# Patient Record
Sex: Female | Born: 1975 | Race: Asian | Hispanic: No | Marital: Married | Smoking: Never smoker
Health system: Southern US, Community
[De-identification: ages and names within clinical notes are randomized; demographics above are authoritative.]

## PROBLEM LIST (undated history)

## (undated) HISTORY — PX: NO PAST SURGERIES: SHX2092

---

## 2007-03-01 ENCOUNTER — Inpatient Hospital Stay (HOSPITAL_COMMUNITY): Admission: AD | Admit: 2007-03-01 | Discharge: 2007-03-01 | Payer: Self-pay | Admitting: Obstetrics and Gynecology

## 2007-08-31 ENCOUNTER — Inpatient Hospital Stay (HOSPITAL_COMMUNITY): Admission: AD | Admit: 2007-08-31 | Discharge: 2007-08-31 | Payer: Self-pay | Admitting: Obstetrics and Gynecology

## 2007-10-11 ENCOUNTER — Inpatient Hospital Stay (HOSPITAL_COMMUNITY): Admission: AD | Admit: 2007-10-11 | Discharge: 2007-10-11 | Payer: Self-pay | Admitting: Obstetrics and Gynecology

## 2007-10-12 ENCOUNTER — Inpatient Hospital Stay (HOSPITAL_COMMUNITY): Admission: AD | Admit: 2007-10-12 | Discharge: 2007-10-14 | Payer: Self-pay | Admitting: Obstetrics and Gynecology

## 2010-06-01 ENCOUNTER — Ambulatory Visit: Payer: Self-pay | Admitting: Obstetrics and Gynecology

## 2010-06-01 ENCOUNTER — Inpatient Hospital Stay (HOSPITAL_COMMUNITY): Admission: AD | Admit: 2010-06-01 | Discharge: 2010-06-01 | Payer: Self-pay | Admitting: Obstetrics & Gynecology

## 2010-09-12 ENCOUNTER — Other Ambulatory Visit (HOSPITAL_COMMUNITY): Payer: Self-pay

## 2010-09-12 ENCOUNTER — Inpatient Hospital Stay (HOSPITAL_COMMUNITY)
Admission: AD | Admit: 2010-09-12 | Discharge: 2010-09-12 | Disposition: A | Discharge status: Home / Self Care | Payer: Self-pay | Source: Ambulatory Visit | Attending: Obstetrics & Gynecology | Admitting: Obstetrics & Gynecology

## 2010-09-12 ENCOUNTER — Inpatient Hospital Stay (HOSPITAL_COMMUNITY): Payer: Self-pay

## 2010-09-12 DIAGNOSIS — O99891 Other specified diseases and conditions complicating pregnancy: Secondary | ICD-10-CM | POA: Insufficient documentation

## 2010-09-12 DIAGNOSIS — O0930 Supervision of pregnancy with insufficient antenatal care, unspecified trimester: Secondary | ICD-10-CM | POA: Insufficient documentation

## 2010-09-19 ENCOUNTER — Other Ambulatory Visit: Payer: Self-pay | Admitting: Obstetrics & Gynecology

## 2010-10-04 LAB — URINALYSIS, ROUTINE W REFLEX MICROSCOPIC
Hgb urine dipstick: NEGATIVE
Protein, ur: NEGATIVE mg/dL
Urobilinogen, UA: 1 mg/dL (ref 0.0–1.0)

## 2010-10-04 LAB — POCT PREGNANCY, URINE: Preg Test, Ur: POSITIVE

## 2010-12-06 NOTE — Discharge Summary (Signed)
[name], [name]                 ACCOUNT NO.:  [id number]   MEDICAL RECORD NO.:  [id number]          PATIENT TYPE:  INP   LOCATION:  9112                          FACILITY:  [facility]   PHYSICIAN:  Gerrit Friends. Aldona Bar, M.D.   DATE OF BIRTH:  04/10/1979   DATE OF ADMISSION:  10/12/2007  DATE OF DISCHARGE:  10/14/2007                               DISCHARGE SUMMARY   DISCHARGE DIAGNOSES:  1. Term pregnancy, delivered 6 pound 14 ounce female.  Apgars 9 and 9.  2. Blood type A positive.   PROCEDURE:  1. Vacuum extraction assisted delivery.  2. Fourth-degree episiotomy and repair.   SUMMARY:  This 35 year old Asian female, G 1, P 0, with due date of  October 09, 2007, presented having contractions after a benign pregnancy.  She had a protracted labor curve and pushed over 2-1/2 hours and because  of maternal exhaustion, was ultimately delivered with the assistance of  a vacuum extractor.  There was a fourth-degree midline episiotomy.  The  patient delivered a 6-pound 14-ounce female infant with Apgars of 9 and  9, and her postpartum course was relatively benign.  Discharge  hemoglobin was 10.8 with a white count of 18,400 and a platelet count of  209,000.   She was bottle-feeding and instructed appropriately on the morning of  discharge.  At the time of discharge, she was given a discharge brochure  with all instructions as well as 2 prescriptions - one for Motrin 600 mg  use every 6 hours and the second for Tylox to use 1-2 every 4-6 hours as  needed for severe pain.  She was also told to use a stool softener at  bedtime for the next 2-3 weeks unless she developed very loose stools.    She will return to the office in followup in approximately 4 weeks' time  or as needed.   CONDITION ON DISCHARGE:  Improved.      Gerrit Friends. Aldona Bar, M.D.  Electronically Signed     RMW/MEDQ  D:  10/14/2007  T:  10/15/2007  Job:  [id number]

## 2011-01-10 ENCOUNTER — Inpatient Hospital Stay (HOSPITAL_COMMUNITY)
Admission: EM | Admit: 2011-01-10 | Discharge: 2011-01-13 | DRG: 775 | Disposition: A | Discharge status: Home / Self Care | Payer: Medicaid Other | Source: Ambulatory Visit | Attending: Obstetrics and Gynecology | Admitting: Obstetrics and Gynecology

## 2011-01-11 LAB — CBC
Hemoglobin: 9.6 g/dL — ABNORMAL LOW (ref 12.0–15.0)
MCH: 23.3 pg — ABNORMAL LOW (ref 26.0–34.0)
MCHC: 30.8 g/dL (ref 30.0–36.0)
MCV: 75.7 fL — ABNORMAL LOW (ref 78.0–100.0)
Platelets: 170 10*3/uL (ref 150–400)
RBC: 4.12 MIL/uL (ref 3.87–5.11)

## 2011-01-12 LAB — CBC
Hemoglobin: 7.8 g/dL — ABNORMAL LOW (ref 12.0–15.0)
MCH: 22.8 pg — ABNORMAL LOW (ref 26.0–34.0)
MCHC: 30.2 g/dL (ref 30.0–36.0)
MCV: 75.4 fL — ABNORMAL LOW (ref 78.0–100.0)
RBC: 3.42 MIL/uL — ABNORMAL LOW (ref 3.87–5.11)

## 2011-04-17 LAB — SYPHILIS: RPR W/REFLEX TO RPR TITER AND TREPONEMAL ANTIBODIES, TRADITIONAL SCREENING AND DIAGNOSIS ALGORITHM: RPR Ser Ql: NONREACTIVE

## 2011-04-17 LAB — CBC
HCT: 31 — ABNORMAL LOW
HCT: 38.3
Hemoglobin: 10.8 — ABNORMAL LOW
Hemoglobin: 13.3
MCHC: 34.6
MCHC: 34.8
MCV: 94.6
MCV: 94.9
Platelets: 209
Platelets: 253
RBC: 3.28 — ABNORMAL LOW
RBC: 4.04
RDW: 12.9
RDW: 13.2
WBC: 15.1 — ABNORMAL HIGH
WBC: 18.4 — ABNORMAL HIGH

## 2011-05-08 LAB — POCT PREGNANCY, URINE
Operator id: 273371
Preg Test, Ur: POSITIVE

## 2019-12-09 ENCOUNTER — Other Ambulatory Visit (HOSPITAL_COMMUNITY)
Admission: RE | Admit: 2019-12-09 | Discharge: 2019-12-09 | Disposition: A | Discharge status: Home / Self Care | Payer: 59 | Source: Ambulatory Visit | Attending: Obstetrics & Gynecology | Admitting: Obstetrics & Gynecology

## 2019-12-09 ENCOUNTER — Encounter: Payer: Self-pay | Admitting: Radiology

## 2019-12-09 ENCOUNTER — Encounter: Payer: Self-pay | Admitting: Obstetrics & Gynecology

## 2019-12-09 ENCOUNTER — Other Ambulatory Visit: Payer: Self-pay

## 2019-12-09 ENCOUNTER — Ambulatory Visit (INDEPENDENT_AMBULATORY_CARE_PROVIDER_SITE_OTHER): Payer: 59 | Admitting: Obstetrics & Gynecology

## 2019-12-09 VITALS — BP 120/78 | HR 78 | Ht 59.0 in | Wt 115.0 lb

## 2019-12-09 DIAGNOSIS — Z975 Presence of (intrauterine) contraceptive device: Secondary | ICD-10-CM | POA: Diagnosis not present

## 2019-12-09 DIAGNOSIS — Z809 Family history of malignant neoplasm, unspecified: Secondary | ICD-10-CM

## 2019-12-09 DIAGNOSIS — Z01419 Encounter for gynecological examination (general) (routine) without abnormal findings: Secondary | ICD-10-CM | POA: Insufficient documentation

## 2019-12-09 DIAGNOSIS — Z1231 Encounter for screening mammogram for malignant neoplasm of breast: Secondary | ICD-10-CM

## 2019-12-09 NOTE — Progress Notes (Signed)
GYNECOLOGY ANNUAL PREVENTATIVE CARE ENCOUNTER NOTE  History:     Olivia [name] is a 44 y.o. [id number] female here for a routine annual gynecologic exam and to establish care.  Had been seen by [facility] for several years, wanted to come here due to proximity to home.  Here with her husband who is doing interpreting for her, declined [place] interpreter services (refusal form signed an scanned into chart).  Current complaints: none.  Had Nexplanon placed last year around 03/2019, no complaints.  Wants to talk about possible genetic testing given FH of breast and uterine/cervical cancer (see FH below).   Denies abnormal vaginal bleeding, discharge, pelvic pain, problems with intercourse or other gynecologic concerns.    Gynecologic History Patient's last menstrual period was 12/02/2019 (approximate). Contraception: Nexplanon Last Pap: 03/2019. Results were: normal with negative HPV Never had a mammogram.  Obstetric History OB History  Gravida Para Term Preterm AB Living  3 3 3     3   SAB TAB Ectopic Multiple Live Births          3    # Outcome Date GA Lbr Len/2nd Weight Sex Delivery Anes PTL Lv  3 Term 2012     Vag-Spont     2 Term 2005     Vag-Spont     1 Term 2002     Vag-Spont      History reviewed. No pertinent past medical history.  History reviewed. No pertinent surgical history.  Current Outpatient Medications on File Prior to Visit  Medication Sig Dispense Refill  . Cetirizine HCl 10 MG CAPS Take 10 mg by mouth daily.     No current facility-administered medications on file prior to visit.   No Known Allergies  Social History:  reports that she has never smoked. She has never used smokeless tobacco. She reports previous alcohol use. She reports previous drug use.  Family History  Problem Relation Age of Onset  . Cervical cancer Mother 27  . Breast cancer Maternal Aunt 32  . Cervical cancer Maternal Aunt 45   The following portions of the patient's  history were reviewed and updated as appropriate: allergies, current medications, past family history, past medical history, past social history, past surgical history and problem list.  Review of Systems Pertinent items noted in HPI and remainder of comprehensive ROS otherwise negative.  Physical Exam:  BP 120/78   Pulse 78   Ht 4\' 11"  (1.499 m)   Wt 115 lb (52.2 kg)   LMP 12/02/2019 (Approximate)   BMI 23.23 kg/m  CONSTITUTIONAL: Well-developed, well-nourished female in no acute distress.  HENT:  Normocephalic, atraumatic, External right and left ear normal. Oropharynx is clear and moist EYES: Conjunctivae and EOM are normal. Pupils are equal, round, and reactive to light. No scleral icterus.  NECK: Normal range of motion, supple, no masses.  Normal thyroid.  SKIN: Skin is warm and dry. No rash noted. Not diaphoretic. No erythema. No pallor. MUSCULOSKELETAL: Normal range of motion. No tenderness.  No cyanosis, clubbing, or edema.  2+ distal pulses. NEUROLOGIC: Alert and oriented to person, place, and time. Normal reflexes, muscle tone coordination.  PSYCHIATRIC: Normal mood and affect. Normal behavior. Normal judgment and thought content. CARDIOVASCULAR: Normal heart rate noted, regular rhythm RESPIRATORY: Clear to auscultation bilaterally. Effort and breath sounds normal, no problems with respiration noted. BREASTS: Symmetric in size. No masses, tenderness, skin changes, nipple drainage, or lymphadenopathy bilaterally. Performed in the presence of a chaperone. ABDOMEN: Soft, no  distention noted.  No tenderness, rebound or guarding.  PELVIC: Normal appearing external genitalia and urethral meatus; normal appearing vaginal mucosa and cervix.  No abnormal discharge noted.  Pap smear obtained.  Normal uterine size, no other palpable masses, no uterine or adnexal tenderness.  Performed in the presence of a chaperone.   Assessment and Plan:      1. Nexplanon in place No issues. Due for  removal/replacement in 03/2022.   2. Breast cancer screening by mammogram Mammogram scheduled - MM 3D SCREEN BREAST BILATERAL; Future  3. FH: cancer Referred to Genetics for further discussion. Did emphasize that if the uterine cancers were cervical, that was HPV related and not necessarily genetic.  - Ambulatory referral to Genetics  4. Well woman exam with routine gynecological exam - Cytology - PAP Will follow up results of pap smear and manage accordingly. Routine preventative health maintenance measures emphasized. Please refer to After Visit Summary for other counseling recommendations.      Jaynie Collins, MD, FACOG Obstetrician & Gynecologist, [facility] for [employer], [facility]

## 2019-12-09 NOTE — Patient Instructions (Addendum)
C?m n qu v? v  ng k MyChart. Vui lng lm theo h?ng d?n d?i y ? truy c?p m?t cch an ton vo h? s y t? tr?c tuy?n c?a qu v?. MyChart cho php qu v?  g?i tin nh?n t?i bc s?, xem k?t qu? xt nghi?m c?a mnh, qu?n l cc bu?i h?n khm  v hn th? n?a.   Lm Cch No ? Ti C Th? ng K? 1. Trong trnh duy?t Internet c?a qu v?, truy c?p [name] [email or web address]. 2. Nh?p vo lin k?t ng K Ngay (Sign Up Now) trong khung ng Nh?p. Qu v? s? th?y [name] ng K Thnh Vin M?i. 3. Nh?p chnh xc M Truy C?p MyChart c?a qu v? nh xu?t hi?n ? bn d?i. Qu v? s? khng c?n ph?i dng t?i m ny n?a sau khi qu v?  hon thnh qu trnh ng k. N?u qu v? khng ng k tr?c ngy h?t h?n, qu v? ph?i yu c?u m?t m m?i.  M Truy C?p MyChart: [id number] Expires: 01/23/2020 10:18 AM  4. Nh?p S? An Sinh X H?i ([id number]) v Ngy Sinh (mm/dd/yyyy) c?a qu  v? nh ?c ch? ?nh v nh?p vo G?i (Submit). Qu v? s? ?c a t?i [name] ng k ti?p theo. 5. T?o ID MyChart. y s? l ID ng nh?p MyChart c?a qu v? v khng th?  thay ?i, v v?y hy ngh? t?i ID ? b?o m?t v d? nh?. 6. T?o m?t kh?u MyChart. Qu v? c th? thay ?i m?t kh?u c?a mnh vo b?t k?  lc no. 7. Nh?p Cu H?i v Cu Tr? L?i ?t L?i M?t Kh?u c?a qu v?. Qu v? c th? s? d?ng cu h?i v cu tr? l?i ny khi qu v? qun m?t m?t kh?u c?a mnh.  8. Nh?p ?a ch? e-mail c?a qu v?. Qu v? s? nh?n ?c thng bo e-mail khi c  s?n thng tin m?i trong MyChart. 9. Nh?p vo ng K (Sign Up). Gi? th qu v? c th? xem h? s y t? c?a mnh.   [name] nh? r?ng, MyChart [facility] ?c s? d?ng cho cc nhu c?u kh?n c?p. ?i v?i cc tr?ng h?p c?p c?u y t?, xin quay s? 911.    Preventive Care 43-43 Years Old, Female Preventive care refers to visits with your health care provider and lifestyle choices that can promote health and wellness. This includes:  A yearly physical exam.  This may also be called an annual well check.  Regular dental visits and eye exams.  Immunizations.  Screening for certain conditions.  Healthy lifestyle choices, such as eating a healthy diet, getting regular exercise, not using drugs or products that contain nicotine and tobacco, and limiting alcohol use. What can I expect for my preventive care visit? Physical exam Your health care provider will check your:  Height and weight. This may be used to calculate body mass index (BMI), which tells if you are at a healthy weight.  Heart rate and blood pressure.  Skin for abnormal spots. Counseling Your health care provider may ask you questions about your:  Alcohol, tobacco, and drug use.  Emotional well-being.  Home and relationship well-being.  Sexual activity.  Eating habits.  Work and work Statistician.  Method of birth control.  Menstrual cycle.  Pregnancy history. What immunizations do I need?  Influenza (flu) vaccine  This is recommended every year. Tetanus, diphtheria, and pertussis (Tdap) vaccine  You may need a  Td booster every 10 years. Varicella (chickenpox) vaccine  You may need this if you have not been vaccinated. Zoster (shingles) vaccine  You may need this after age 61. Measles, mumps, and rubella (MMR) vaccine  You may need at least one dose of MMR if you were born in 1957 or later. You may also need a second dose. Pneumococcal conjugate (PCV13) vaccine  You may need this if you have certain conditions and were not previously vaccinated. Pneumococcal polysaccharide (PPSV23) vaccine  You may need one or two doses if you smoke cigarettes or if you have certain conditions. Meningococcal conjugate (MenACWY) vaccine  You may need this if you have certain conditions. Hepatitis A vaccine  You may need this if you have certain conditions or if you travel or work in places where you may be exposed to hepatitis A. Hepatitis B vaccine  You may  need this if you have certain conditions or if you travel or work in places where you may be exposed to hepatitis B. Haemophilus influenzae type b (Hib) vaccine  You may need this if you have certain conditions. Human papillomavirus (HPV) vaccine  If recommended by your health care provider, you may need three doses over 6 months. You may receive vaccines as individual doses or as more than one vaccine together in one shot (combination vaccines). Talk with your health care provider about the risks and benefits of combination vaccines. What tests do I need? Blood tests  Lipid and cholesterol levels. These may be checked every 5 years, or more frequently if you are over 2 years old.  Hepatitis C test.  Hepatitis B test. Screening  Lung cancer screening. You may have this screening every year starting at age 35 if you have a 30-pack-year history of smoking and currently smoke or have quit within the past 15 years.  Colorectal cancer screening. All adults should have this screening starting at age 25 and continuing until age 35. Your health care provider may recommend screening at age 30 if you are at increased risk. You will have tests every 1-10 years, depending on your results and the type of screening test.  Diabetes screening. This is done by checking your blood sugar (glucose) after you have not eaten for a while (fasting). You may have this done every 1-3 years.  Mammogram. This may be done every 1-2 years. Talk with your health care provider about when you should start having regular mammograms. This may depend on whether you have a family history of breast cancer.  BRCA-related cancer screening. This may be done if you have a family history of breast, ovarian, tubal, or peritoneal cancers.  Pelvic exam and Pap test. This may be done every 3 years starting at age 64. Starting at age 4, this may be done every 5 years if you have a Pap test in combination with an HPV test. Other  tests  Sexually transmitted disease (STD) testing.  Bone density scan. This is done to screen for osteoporosis. You may have this scan if you are at high risk for osteoporosis. Follow these instructions at home: Eating and drinking  Eat a diet that includes fresh fruits and vegetables, whole grains, lean protein, and low-fat dairy.  Take vitamin and mineral supplements as recommended by your health care provider.  Do not drink alcohol if: ? Your health care provider tells you not to drink. ? You are pregnant, may be pregnant, or are planning to become pregnant.  If you drink alcohol: ?  Limit how much you have to 0-1 drink a day. ? Be aware of how much alcohol is in your drink. In the [place] one drink equals one 12 oz bottle of beer (355 mL), one 5 oz glass of wine (148 mL), or one 1 oz glass of hard liquor (44 mL). Lifestyle  Take daily care of your teeth and gums.  Stay active. Exercise for at least 30 minutes on 5 or more days each week.  Do not use any products that contain nicotine or tobacco, such as cigarettes, e-cigarettes, and chewing tobacco. If you need help quitting, ask your health care provider.  If you are sexually active, practice safe sex. Use a condom or other form of birth control (contraception) in order to prevent pregnancy and STIs (sexually transmitted infections).  If told by your health care provider, take low-dose aspirin daily starting at age 72. What's next?  Visit your health care provider once a year for a well check visit.  Ask your health care provider how often you should have your eyes and teeth checked.  Stay up to date on all vaccines. This information is not intended to replace advice given to you by your health care provider. Make sure you discuss any questions you have with your health care provider. Document Revised: 03/21/2018 Document Reviewed: 03/21/2018 Elsevier Patient Education  2020 [employer].

## 2019-12-10 LAB — CYTOLOGY - PAP
Comment: NEGATIVE
Diagnosis: NEGATIVE
High risk HPV: NEGATIVE

## 2019-12-26 ENCOUNTER — Ambulatory Visit: Payer: 59

## 2020-02-05 ENCOUNTER — Other Ambulatory Visit: Payer: Self-pay

## 2020-02-05 ENCOUNTER — Ambulatory Visit
Admission: RE | Admit: 2020-02-05 | Discharge: 2020-02-05 | Disposition: A | Discharge status: Home / Self Care | Payer: 59 | Source: Ambulatory Visit | Attending: Obstetrics & Gynecology | Admitting: Obstetrics & Gynecology

## 2020-02-05 DIAGNOSIS — Z1231 Encounter for screening mammogram for malignant neoplasm of breast: Secondary | ICD-10-CM

## 2020-02-10 ENCOUNTER — Other Ambulatory Visit: Payer: Self-pay | Admitting: Obstetrics & Gynecology

## 2020-02-10 DIAGNOSIS — R928 Other abnormal and inconclusive findings on diagnostic imaging of breast: Secondary | ICD-10-CM

## 2020-02-20 ENCOUNTER — Other Ambulatory Visit: Payer: Self-pay

## 2020-02-20 ENCOUNTER — Ambulatory Visit
Admission: RE | Admit: 2020-02-20 | Discharge: 2020-02-20 | Disposition: A | Discharge status: Home / Self Care | Payer: 59 | Source: Ambulatory Visit | Attending: Obstetrics & Gynecology | Admitting: Obstetrics & Gynecology

## 2020-02-20 ENCOUNTER — Ambulatory Visit: Payer: 59

## 2020-02-20 DIAGNOSIS — R928 Other abnormal and inconclusive findings on diagnostic imaging of breast: Secondary | ICD-10-CM

## 2020-08-02 ENCOUNTER — Ambulatory Visit: Payer: 59 | Admitting: Family Medicine

## 2020-08-02 ENCOUNTER — Other Ambulatory Visit: Payer: Self-pay

## 2020-08-02 ENCOUNTER — Encounter: Payer: Self-pay | Admitting: Family Medicine

## 2020-08-02 VITALS — BP 120/78 | HR 76 | Temp 97.8°F | Ht 58.1 in | Wt 110.5 lb

## 2020-08-02 DIAGNOSIS — J302 Other seasonal allergic rhinitis: Secondary | ICD-10-CM | POA: Insufficient documentation

## 2020-08-02 DIAGNOSIS — Z23 Encounter for immunization: Secondary | ICD-10-CM | POA: Diagnosis not present

## 2020-08-02 DIAGNOSIS — Z1159 Encounter for screening for other viral diseases: Secondary | ICD-10-CM

## 2020-08-02 DIAGNOSIS — Z114 Encounter for screening for human immunodeficiency virus [HIV]: Secondary | ICD-10-CM | POA: Diagnosis not present

## 2020-08-02 DIAGNOSIS — R5383 Other fatigue: Secondary | ICD-10-CM

## 2020-08-02 DIAGNOSIS — Z13220 Encounter for screening for lipoid disorders: Secondary | ICD-10-CM

## 2020-08-02 DIAGNOSIS — R400 Somnolence: Secondary | ICD-10-CM | POA: Diagnosis not present

## 2020-08-02 HISTORY — DX: Other fatigue: R53.83

## 2020-08-02 HISTORY — DX: Other seasonal allergic rhinitis: J30.2

## 2020-08-02 LAB — CBC
HCT: 36.9 % (ref 36.0–46.0)
Hemoglobin: 12 g/dL (ref 12.0–15.0)
MCHC: 32.5 g/dL (ref 30.0–36.0)
MCV: 85.8 fl (ref 78.0–100.0)
Platelets: 278 10*3/uL (ref 150.0–400.0)
RBC: 4.3 Mil/uL (ref 3.87–5.11)
RDW: 12.3 % (ref 11.5–15.5)
WBC: 6.4 10*3/uL (ref 4.0–10.5)

## 2020-08-02 LAB — COMPREHENSIVE METABOLIC PANEL
ALT: 11 U/L (ref 0–35)
AST: 14 U/L (ref 0–37)
Albumin: 4.7 g/dL (ref 3.5–5.2)
Alkaline Phosphatase: 36 U/L — ABNORMAL LOW (ref 39–117)
BUN: 13 mg/dL (ref 6–23)
CO2: 32 mEq/L (ref 19–32)
Calcium: 9.7 mg/dL (ref 8.4–10.5)
Chloride: 104 mEq/L (ref 96–112)
Creatinine, Ser: 0.7 mg/dL (ref 0.40–1.20)
GFR: 105.48 mL/min (ref >60.00)
Glucose, Bld: 96 mg/dL (ref 70–99)
Potassium: 3.9 mEq/L (ref 3.5–5.1)
Sodium: 139 mEq/L (ref 135–145)
Total Bilirubin: 0.5 mg/dL (ref 0.2–1.2)
Total Protein: 7.6 g/dL (ref 6.0–8.3)

## 2020-08-02 LAB — LDL CHOLESTEROL, DIRECT: Direct LDL: 125 mg/dL

## 2020-08-02 LAB — LIPID PANEL
Cholesterol: 216 mg/dL — ABNORMAL HIGH (ref 0–200)
HDL: 54.6 mg/dL (ref >39.00)
NonHDL: 161.5
Total CHOL/HDL Ratio: 4
Triglycerides: 249 mg/dL — ABNORMAL HIGH (ref 0.0–149.0)
VLDL: 49.8 mg/dL — ABNORMAL HIGH (ref 0.0–40.0)

## 2020-08-02 LAB — TSH: TSH: 0.85 u[IU]/mL (ref 0.35–4.50)

## 2020-08-02 LAB — FERRITIN: Ferritin: 50.9 ng/mL (ref 10.0–291.0)

## 2020-08-02 LAB — VITAMIN D 25 HYDROXY (VIT D DEFICIENCY, FRACTURES): VITD: 30.2 ng/mL (ref 30.00–100.00)

## 2020-08-02 NOTE — Assessment & Plan Note (Signed)
Will get labs. Epworth 5, so consider sleep apnea though normal weight but does snore.

## 2020-08-02 NOTE — Progress Notes (Signed)
Subjective:     Olivia [name] is a 45 y.o. female presenting for Establish Care (Wants blood work)   Here with husband Declined interpretor   HPI   #Fatigue - notes that she feels sleepy during the day - abnormal bleeding - 10 days of bleeding in the last month, very light flow with nexplanon inplace - husband says she snores, does not stop breathing - sleeps about 7 hours at night - normal amount, no insomnia concerns - occasionally waking up tired  Review of Systems  Constitutional: Negative for chills, fever and unexpected weight change.  Eyes:       Blurry vision  Endocrine: Negative for cold intolerance and heat intolerance.  Skin: Negative for color change and rash.  Neurological: Positive for headaches. Negative for dizziness and light-headedness.     Social History   Tobacco Use  Smoking Status Never Smoker  Smokeless Tobacco Never Used        Objective:    BP Readings from Last 3 Encounters:  08/02/20 120/78  12/09/19 120/78   Wt Readings from Last 3 Encounters:  08/02/20 110 lb 8 oz (50.1 kg)  12/09/19 115 lb (52.2 kg)    BP 120/78   Pulse 76   Temp 97.8 F (36.6 C) (Temporal)   Ht 4' 10.1" (1.476 m)   Wt 110 lb 8 oz (50.1 kg)   SpO2 100%   BMI 23.02 kg/m    Physical Exam Constitutional:      General: She is not in acute distress.    Appearance: She is well-developed. She is not diaphoretic.  HENT:     Right Ear: External ear normal.     Left Ear: External ear normal.     Nose: Nose normal.     Mouth/Throat:     Mouth: Mucous membranes are moist.     Tonsils: 0 on the right. 0 on the left.  Eyes:     Conjunctiva/sclera: Conjunctivae normal.  Neck:     Comments: Normal thyroid Cardiovascular:     Rate and Rhythm: Normal rate and regular rhythm.     Heart sounds: No murmur heard.   Pulmonary:     Effort: Pulmonary effort is normal. No respiratory distress.     Breath sounds: Normal breath sounds. No wheezing.   Musculoskeletal:     Cervical back: Normal range of motion and neck supple. No tenderness.  Skin:    General: Skin is warm and dry.     Capillary Refill: Capillary refill takes less than 2 seconds.  Neurological:     Mental Status: She is alert. Mental status is at baseline.  Psychiatric:        Mood and Affect: Mood normal.        Behavior: Behavior normal.      Results of the Epworth flowsheet 08/02/2020  Sitting and reading 1  Watching TV 0  Sitting, inactive in a public place (e.g. a theatre or a meeting) 0  As a passenger in a car for an hour without a break 0  Lying down to rest in the afternoon when circumstances permit 3  Sitting and talking to someone 0  Sitting quietly after a lunch without alcohol 1  In a car, while stopped for a few minutes in traffic 0  Total score 5        Assessment & Plan:   Problem List Items Addressed This Visit      Other   Seasonal allergies  Other fatigue - Primary    Will get labs. Epworth 5, so consider sleep apnea though normal weight but does snore.       Relevant Orders   Comprehensive metabolic panel   CBC   Ferritin   Vitamin D, 25-hydroxy   TSH    Other Visit Diagnoses    Need for hepatitis C screening test       Relevant Orders   Hepatitis C antibody   Encounter for screening for HIV       Relevant Orders   HIV Antibody (routine testing w rflx)   Daytime sleepiness       Relevant Orders   Comprehensive metabolic panel   CBC   Ferritin   Vitamin D, 25-hydroxy   TSH   Screening for hyperlipidemia       Relevant Orders   Lipid panel   Need for Tdap vaccination       Relevant Orders   Tdap vaccine greater than or equal to 7yo IM (Completed)       Return if symptoms worsen or fail to improve.  Lynnda Child, MD  This visit occurred during the SARS-CoV-2 public health emergency.  Safety protocols were in place, including screening questions prior to the visit, additional usage of staff PPE, and  extensive cleaning of exam room while observing appropriate contact time as indicated for disinfecting solutions.

## 2020-08-02 NOTE — Patient Instructions (Signed)
Daytime sleepiness - will start with blood work - if everything is normal, will likely recommend sleep apnea referral  Hand lesion - this may be a cyst - it should get better on its own - if worsening or not improving - return to see Dr. Patsy Lager

## 2020-08-03 LAB — HEPATITIS C ANTIBODY
Hepatitis C Ab: NONREACTIVE
SIGNAL TO CUT-OFF: 0.01 (ref <1.00)

## 2020-08-03 LAB — HIV ANTIBODY (ROUTINE TESTING W REFLEX): HIV 1&2 Ab, 4th Generation: NONREACTIVE

## 2020-10-18 ENCOUNTER — Encounter: Payer: Self-pay | Admitting: Radiology

## 2020-12-14 ENCOUNTER — Encounter: Payer: Self-pay | Admitting: Obstetrics & Gynecology

## 2020-12-14 ENCOUNTER — Other Ambulatory Visit: Payer: Self-pay

## 2020-12-14 ENCOUNTER — Ambulatory Visit (INDEPENDENT_AMBULATORY_CARE_PROVIDER_SITE_OTHER): Payer: 59 | Admitting: Obstetrics & Gynecology

## 2020-12-14 VITALS — BP 108/71 | HR 81 | Ht 59.0 in | Wt 112.8 lb

## 2020-12-14 DIAGNOSIS — Z1231 Encounter for screening mammogram for malignant neoplasm of breast: Secondary | ICD-10-CM

## 2020-12-14 DIAGNOSIS — Z975 Presence of (intrauterine) contraceptive device: Secondary | ICD-10-CM | POA: Diagnosis not present

## 2020-12-14 DIAGNOSIS — Z01419 Encounter for gynecological examination (general) (routine) without abnormal findings: Secondary | ICD-10-CM | POA: Diagnosis not present

## 2020-12-14 NOTE — Progress Notes (Signed)
GYNECOLOGY ANNUAL PREVENTATIVE CARE ENCOUNTER NOTE  History:     Aleasha Fregeau Rosangelica Pevehouse is a 45 y.o. [phone] female here for a routine annual exam. Here with her husband who is doing interpreting for her, declined [place] medical interpreter services (refusal form signed an scanned into chart). Current complaints: none.   Denies abnormal vaginal bleeding, discharge, pelvic pain, problems with intercourse or other gynecologic concerns.    Gynecologic History No LMP recorded. Patient has had an implant. Contraception: Nexplanon placed 03/2019, no issues. Last Pap: 12/09/2019. Results were: normal with negative HPV Last mammogram: 730/2021. Results were: normal  Obstetric History OB History  Gravida Para Term Preterm AB Living  3 3 3     3   SAB IAB Ectopic Multiple Live Births          3    # Outcome Date GA Lbr Len/2nd Weight Sex Delivery Anes PTL Lv  3 Term 2012     Vag-Spont     2 Term 2005     Vag-Spont     1 Term 2002     Vag-Spont       Past Medical History:  Diagnosis Date  . Other fatigue 08/02/2020  . Seasonal allergies 08/02/2020    History reviewed. No pertinent surgical history.  Current Outpatient Medications on File Prior to Visit  Medication Sig Dispense Refill  . etonogestrel (NEXPLANON) 68 MG IMPL implant Nexplanon 68 mg subdermal implant  Inject by subcutaneous route. Due for removal in 3 years    . Cetirizine HCl 10 MG CAPS Take 10 mg by mouth daily. (Patient not taking: Reported on 08/02/2020)     No current facility-administered medications on file prior to visit.    No Known Allergies  Social History:  reports that she has never smoked. She has never used smokeless tobacco. She reports previous alcohol use. She reports previous drug use.  Family History  Problem Relation Age of Onset  . Cervical cancer Mother 62  . Breast cancer Maternal Aunt 49  . Cervical cancer Maternal Aunt 45  . Heart attack Maternal Grandfather     The following  portions of the patient's history were reviewed and updated as appropriate: allergies, current medications, past family history, past medical history, past social history, past surgical history and problem list.  Review of Systems Pertinent items noted in HPI and remainder of comprehensive ROS otherwise negative.  Physical Exam:  BP 108/71   Pulse 81   Ht 4\' 11"  (1.499 m)   Wt 112 lb 12.8 oz (51.2 kg)   BMI 22.78 kg/m  CONSTITUTIONAL: Well-developed, well-nourished female in no acute distress.  HENT:  Normocephalic, atraumatic, External right and left ear normal.  EYES: Conjunctivae and EOM are normal. Pupils are equal, round, and reactive to light. No scleral icterus.  NECK: Normal range of motion, supple, no masses.  Normal thyroid.  SKIN: Skin is warm and dry. No rash noted. Not diaphoretic. No erythema. No pallor. MUSCULOSKELETAL: Normal range of motion. No tenderness.  No cyanosis, clubbing, or edema. NEUROLOGIC: Alert and oriented to person, place, and time. Normal reflexes, muscle tone coordination.  PSYCHIATRIC: Normal mood and affect. Normal behavior. Normal judgment and thought content. CARDIOVASCULAR: Normal heart rate noted, regular rhythm RESPIRATORY: Clear to auscultation bilaterally. Effort and breath sounds normal, no problems with respiration noted. BREASTS: Symmetric in size. No masses, tenderness, skin changes, nipple drainage, or lymphadenopathy bilaterally. Performed in the presence of a chaperone. ABDOMEN: Soft, no distention noted.  No  tenderness, rebound or guarding.  PELVIC: Deferred   Assessment and Plan:      1. Nexplanon in place No issues. Due for removal/replacement in 03/2022.  2. Breast cancer screening by mammogram Mammogram scheduled - MM 3D SCREEN BREAST BILATERAL; Future  3. Well woman exam Up to date with pap smear screening, due for next one in 11/2022 Routine preventative health maintenance measures emphasized. Please refer to After Visit  Summary for other counseling recommendations.      Jaynie Collins, MD, FACOG Obstetrician & Gynecologist, [facility] for [employer], [facility]

## 2020-12-14 NOTE — Patient Instructions (Signed)
Preventive Care 84-44 Years Old, Female Preventive care refers to lifestyle choices and visits with your health care provider that can promote health and wellness. This includes:  A yearly physical exam. This is also called an annual wellness visit.  Regular dental and eye exams.  Immunizations.  Screening for certain conditions.  Healthy lifestyle choices, such as: ? Eating a healthy diet. ? Getting regular exercise. ? Not using drugs or products that contain nicotine and tobacco. ? Limiting alcohol use. What can I expect for my preventive care visit? Physical exam Your health care provider will check your:  Height and weight. These may be used to calculate your BMI (body mass index). BMI is a measurement that tells if you are at a healthy weight.  Heart rate and blood pressure.  Body temperature.  Skin for abnormal spots. Counseling Your health care provider may ask you questions about your:  Past medical problems.  Family's medical history.  Alcohol, tobacco, and drug use.  Emotional well-being.  Home life and relationship well-being.  Sexual activity.  Diet, exercise, and sleep habits.  Work and work Statistician.  Access to firearms.  Method of birth control.  Menstrual cycle.  Pregnancy history. What immunizations do I need? Vaccines are usually given at various ages, according to a schedule. Your health care provider will recommend vaccines for you based on your age, medical history, and lifestyle or other factors, such as travel or where you work.   What tests do I need? Blood tests  Lipid and cholesterol levels. These may be checked every 5 years, or more often if you are over 3 years old.  Hepatitis C test.  Hepatitis B test. Screening  Lung cancer screening. You may have this screening every year starting at age 73 if you have a 30-pack-year history of smoking and currently smoke or have quit within the past 15 years.  Colorectal cancer  screening. ? All adults should have this screening starting at age 52 and continuing until age 17. ? Your health care provider may recommend screening at age 49 if you are at increased risk. ? You will have tests every 1-10 years, depending on your results and the type of screening test.  Diabetes screening. ? This is done by checking your blood sugar (glucose) after you have not eaten for a while (fasting). ? You may have this done every 1-3 years.  Mammogram. ? This may be done every 1-2 years. ? Talk with your health care provider about when you should start having regular mammograms. This may depend on whether you have a family history of breast cancer.  BRCA-related cancer screening. This may be done if you have a family history of breast, ovarian, tubal, or peritoneal cancers.  Pelvic exam and Pap test. ? This may be done every 3 years starting at age 10. ? Starting at age 11, this may be done every 5 years if you have a Pap test in combination with an HPV test. Other tests  STD (sexually transmitted disease) testing, if you are at risk.  Bone density scan. This is done to screen for osteoporosis. You may have this scan if you are at high risk for osteoporosis. Talk with your health care provider about your test results, treatment options, and if necessary, the need for more tests. Follow these instructions at home: Eating and drinking  Eat a diet that includes fresh fruits and vegetables, whole grains, lean protein, and low-fat dairy products.  Take vitamin and mineral supplements  as recommended by your health care provider.  Do not drink alcohol if: ? Your health care provider tells you not to drink. ? You are pregnant, may be pregnant, or are planning to become pregnant.  If you drink alcohol: ? Limit how much you have to 0-1 drink a day. ? Be aware of how much alcohol is in your drink. In the [place] one drink equals one 12 oz bottle of beer (355 mL), one 5 oz glass of  wine (148 mL), or one 1 oz glass of hard liquor (44 mL).   Lifestyle  Take daily care of your teeth and gums. Brush your teeth every morning and night with fluoride toothpaste. Floss one time each day.  Stay active. Exercise for at least 30 minutes 5 or more days each week.  Do not use any products that contain nicotine or tobacco, such as cigarettes, e-cigarettes, and chewing tobacco. If you need help quitting, ask your health care provider.  Do not use drugs.  If you are sexually active, practice safe sex. Use a condom or other form of protection to prevent STIs (sexually transmitted infections).  If you do not wish to become pregnant, use a form of birth control. If you plan to become pregnant, see your health care provider for a prepregnancy visit.  If told by your health care provider, take low-dose aspirin daily starting at age 50.  Find healthy ways to cope with stress, such as: ? Meditation, yoga, or listening to music. ? Journaling. ? Talking to a trusted person. ? Spending time with friends and family. Safety  Always wear your seat belt while driving or riding in a vehicle.  Do not drive: ? If you have been drinking alcohol. Do not ride with someone who has been drinking. ? When you are tired or distracted. ? While texting.  Wear a helmet and other protective equipment during sports activities.  If you have firearms in your house, make sure you follow all gun safety procedures. What's next?  Visit your health care provider once a year for an annual wellness visit.  Ask your health care provider how often you should have your eyes and teeth checked.  Stay up to date on all vaccines. This information is not intended to replace advice given to you by your health care provider. Make sure you discuss any questions you have with your health care provider. Document Revised: 04/13/2020 Document Reviewed: 03/21/2018 Elsevier Patient Education  2021 Elsevier Inc.  

## 2021-02-10 ENCOUNTER — Other Ambulatory Visit: Payer: Self-pay

## 2021-02-10 ENCOUNTER — Ambulatory Visit
Admission: RE | Admit: 2021-02-10 | Discharge: 2021-02-10 | Disposition: A | Discharge status: Home / Self Care | Payer: 59 | Source: Ambulatory Visit | Attending: Obstetrics & Gynecology | Admitting: Obstetrics & Gynecology

## 2021-02-10 DIAGNOSIS — Z1231 Encounter for screening mammogram for malignant neoplasm of breast: Secondary | ICD-10-CM

## 2021-02-22 ENCOUNTER — Encounter: Payer: Self-pay | Admitting: Radiology

## 2021-10-18 ENCOUNTER — Encounter: Payer: Self-pay | Admitting: Family Medicine

## 2021-10-18 ENCOUNTER — Other Ambulatory Visit: Payer: Self-pay

## 2021-10-18 ENCOUNTER — Ambulatory Visit (INDEPENDENT_AMBULATORY_CARE_PROVIDER_SITE_OTHER): Payer: 59 | Admitting: Family Medicine

## 2021-10-18 VITALS — BP 120/70 | HR 87 | Temp 98.1°F | Ht <= 58 in | Wt 115.1 lb

## 2021-10-18 DIAGNOSIS — E782 Mixed hyperlipidemia: Secondary | ICD-10-CM | POA: Diagnosis not present

## 2021-10-18 DIAGNOSIS — Z1211 Encounter for screening for malignant neoplasm of colon: Secondary | ICD-10-CM | POA: Diagnosis not present

## 2021-10-18 DIAGNOSIS — Z0000 Encounter for general adult medical examination without abnormal findings: Secondary | ICD-10-CM | POA: Diagnosis not present

## 2021-10-18 DIAGNOSIS — R400 Somnolence: Secondary | ICD-10-CM | POA: Diagnosis not present

## 2021-10-18 DIAGNOSIS — R42 Dizziness and giddiness: Secondary | ICD-10-CM

## 2021-10-18 LAB — COMPREHENSIVE METABOLIC PANEL
ALT: 10 U/L (ref 0–35)
AST: 14 U/L (ref 0–37)
Albumin: 4.6 g/dL (ref 3.5–5.2)
Alkaline Phosphatase: 40 U/L (ref 39–117)
BUN: 14 mg/dL (ref 6–23)
CO2: 30 mEq/L (ref 19–32)
Calcium: 9.7 mg/dL (ref 8.4–10.5)
Chloride: 101 mEq/L (ref 96–112)
Creatinine, Ser: 0.56 mg/dL (ref 0.40–1.20)
GFR: 110.36 mL/min (ref >60.00)
Glucose, Bld: 90 mg/dL (ref 70–99)
Potassium: 4 mEq/L (ref 3.5–5.1)
Sodium: 138 mEq/L (ref 135–145)
Total Bilirubin: 0.4 mg/dL (ref 0.2–1.2)
Total Protein: 7.5 g/dL (ref 6.0–8.3)

## 2021-10-18 LAB — LIPID PANEL
Cholesterol: 207 mg/dL — ABNORMAL HIGH (ref 0–200)
HDL: 54.1 mg/dL (ref >39.00)
LDL Cholesterol: 114 mg/dL — ABNORMAL HIGH (ref 0–99)
NonHDL: 152.95
Total CHOL/HDL Ratio: 4
Triglycerides: 193 mg/dL — ABNORMAL HIGH (ref 0.0–149.0)
VLDL: 38.6 mg/dL (ref 0.0–40.0)

## 2021-10-18 LAB — FERRITIN: Ferritin: 43.5 ng/mL (ref 10.0–291.0)

## 2021-10-18 LAB — CBC
HCT: 37.8 % (ref 36.0–46.0)
Hemoglobin: 12.3 g/dL (ref 12.0–15.0)
MCHC: 32.6 g/dL (ref 30.0–36.0)
MCV: 85.8 fl (ref 78.0–100.0)
Platelets: 302 10*3/uL (ref 150.0–400.0)
RBC: 4.41 Mil/uL (ref 3.87–5.11)
RDW: 12.5 % (ref 11.5–15.5)
WBC: 9 10*3/uL (ref 4.0–10.5)

## 2021-10-18 LAB — TSH: TSH: 1.38 u[IU]/mL (ref 0.35–5.50)

## 2021-10-18 NOTE — Assessment & Plan Note (Signed)
After meals. Suspect some dehydration vs anemia with abnormal bleeding. Labs today and advised increasing water intake. Epworth 3.  ?

## 2021-10-18 NOTE — Patient Instructions (Addendum)
Lightheadedness ?- drink at least 64 oz of water day ?- or any caffeine - free beverage ?- if you drink caffeine make sure to drink an equal amount of water ?- labs today to check for anemia and thyroid disorder ? ?#Referral ?I have placed a referral to a specialist for you. You should receive a phone call from the specialty office. Make sure your voicemail is not full and that if you are able to answer your phone to unknown or new numbers.  ? ?It may take up to 2 weeks to hear about the referral. If you do not hear anything in 2 weeks, please call our office and ask to speak with the referral coordinator.  ? ?

## 2021-10-18 NOTE — Progress Notes (Signed)
Annual Exam  ? ?Chief Complaint:  ?Chief Complaint  ?Patient presents with  ? Annual Exam  ? Fatigue  ?  After every meal, that last 20-30 mins   ? ?Declined interpreter - here with husband ? ?History of Present Illness:  ?Ms. Olivia [name] is a 46 y.o. [phone] who LMP was No LMP recorded. Patient has had an implant., presents today for her annual examination.   ? ?#Head heavy ?- after meals 20-30s ?- feels sleepy after eating ?- occasional nausea ?- home cooked meals ?- rice is a common item in meals ?- does not eat breakfast ? ?Nutrition ?Diet: rice, home cooked ?Exercise: not currently ?She does get adequate calcium and Vitamin D in her diet. ? ? ?Social History  ? ?Tobacco Use  ?Smoking Status Never  ?Smokeless Tobacco Never  ? ?Social History  ? ?Substance and Sexual Activity  ?Alcohol Use Yes  ? Comment: occasional  ? ?Social History  ? ?Substance and Sexual Activity  ?Drug Use Not Currently  ? ? ?Safety ?The patient wears seatbelts: yes.     ?The patient feels safe at home and in their relationships: yes. ? ?General Health ?Dentist in the last year: Yes ?Eye doctor: no ? ?Menstrual ?Her menses are every 4 months. Sometimes having longer cycles. Irregular for the last year. Has nexplanon - due for removal in 03/2022 ? ?GYN ?She is single partner, contraception - implant.  ? ? ?Cervical Cancer Screening:   ?Last Pap:   May 2021 Results were: no abnormalities /neg HPV DNA  ? ? ?Breast Cancer Screening ?There is no FH of breast cancer. There is no FH of ovarian cancer. BRCA screening Not Indicated.  ?Discussed that for average risk women between age 59-49 screening may reduce the risk of breast cancer death, however, at a lower rate than those over age 20. And that the the false-positive rates resulting in unnecessary biopsies with more screening is higher. The balance of benefits vs harms likely improves as you progress through your 40s. The patient does want a mammogram this year.  ? ?Colon Cancer  Screening:  ?Age 60-75 yo - benefits outweigh the risk. Adults 51-85 yo who have never been screened benefit.  ?Benefits: 134000 people in 2016 will be diagnosed and 49,000 will die - early detection helps ?Harms: Complications 2/2 to colonoscopy ?High Risk (Colonoscopy): genetic disorder (Lynch syndrome or familial adenomatous polyposis), personal hx of IBD, previous adenomatous polyp, or previous colorectal cancer, FamHx start 10 years before the age at diagnosis, increased in males and black race ? ?Options:  ?FIT - looks for hemoglobin (blood in the stool) - specific and fairly sensitive - must be done annually ?Cologuard - looks for DNA and blood - more sensitive - therefore can have more false positives, every 3 years ?Colonoscopy - every 10 years if normal - sedation, bowl prep, must have someone drive you ? ?Shared decision making and the patient had decided to do colonoscopy. ? ?Weight ?Wt Readings from Last 3 Encounters:  ?10/18/21 115 lb 2 oz (52.2 kg)  ?12/14/20 112 lb 12.8 oz (51.2 kg)  ?08/02/20 110 lb 8 oz (50.1 kg)  ? ?Patient has normal BMI  ?BMI Readings from Last 1 Encounters:  ?10/18/21 24.06 kg/m?  ? ? ? ?Chronic disease screening ?Blood pressure monitoring:  ?BP Readings from Last 3 Encounters:  ?10/18/21 120/70  ?12/14/20 108/71  ?08/02/20 120/78  ? ? ?Lipid Monitoring: Indication for screening: age >26, obesity, diabetes, family hx, CV risk  factors.  ?Lipid screening: Yes ? ?Lab Results  ?Component Value Date  ? CHOL 216 (H) 08/02/2020  ? HDL 54.60 08/02/2020  ? LDLDIRECT 125.0 08/02/2020  ? TRIG 249.0 (H) 08/02/2020  ? CHOLHDL 4 08/02/2020  ? ? ? ?Diabetes Screening: age >16, overweight, family hx, PCOS, hx of gestational diabetes, at risk ethnicity ?Diabetes Screening screening: Yes ? ?No results found for: HGBA1C ? ? ?Past Medical History:  ?Diagnosis Date  ? Other fatigue 08/02/2020  ? Seasonal allergies 08/02/2020  ? ? ?No past surgical history on file. ? ?Prior to Admission medications    ?Medication Sig Start Date End Date Taking? Authorizing Provider  ?etonogestrel (NEXPLANON) 68 MG IMPL implant Nexplanon 68 mg subdermal implant ? Inject by subcutaneous route. Due for removal in 3 years   Yes [provider]  ?loratadine (CLARITIN) 10 MG tablet Take 10 mg by mouth daily.   Yes [provider]  ? ? ?No Known Allergies ? ?Gynecologic History: No LMP recorded. Patient has had an implant. ? ?Obstetric History: [id number] ? ?Social History  ? ?Socioeconomic History  ? Marital status: Married  ?  Spouse name: Savonglam  ? Number of children: 3  ? Years of education: high school  ? Highest education level: Not on file  ?Occupational History  ? Not on file  ?Tobacco Use  ? Smoking status: Never  ? Smokeless tobacco: Never  ?Vaping Use  ? Vaping Use: Never used  ?Substance and Sexual Activity  ? Alcohol use: Yes  ?  Comment: occasional  ? Drug use: Not Currently  ? Sexual activity: Yes  ?  Birth control/protection: Implant  ?Other Topics Concern  ? Not on file  ?Social History Narrative  ? 08/02/20  ? From: [place], moved 2011  ? Living: with husband, Sovong lam (2011) and 3 children  ? Work: Company secretary   ?   ? Family: 3 children - Taam (2002), Rolm Gala (2005, Ian (2012)   ?   ? Enjoys: go to the beach  ?   ? Exercise: not currently  ? Diet: home cooked, variety of meat and veggies  ?   ? Safety  ? Seat belts: Yes   ? Guns: Yes  and secure  ? Safe in relationships: Yes   ? ?Social Determinants of Health  ? ?Financial Resource Strain: Not on file  ?Food Insecurity: Not on file  ?Transportation Needs: Not on file  ?Physical Activity: Not on file  ?Stress: Not on file  ?Social Connections: Not on file  ?Intimate Partner Violence: Not on file  ? ? ?Family History  ?Problem Relation Age of Onset  ? Cervical cancer Mother 52  ? Breast cancer Maternal Aunt 67  ? Cervical cancer Maternal Aunt 45  ? Heart attack Maternal Grandfather   ? ? ?Review of Systems  ?Constitutional:  Negative for chills and  fever.  ?HENT:  Negative for congestion and sore throat.   ?Eyes:  Negative for blurred vision and double vision.  ?Respiratory:  Negative for shortness of breath.   ?Cardiovascular:  Negative for chest pain.  ?Gastrointestinal:  Negative for heartburn, nausea and vomiting.  ?Genitourinary: Negative.   ?Musculoskeletal: Negative.  Negative for myalgias.  ?Skin:  Negative for rash.  ?Neurological:  Negative for dizziness and headaches.  ?Endo/Heme/Allergies:  Does not bruise/bleed easily.  ?Psychiatric/Behavioral:  Negative for depression. The patient is not nervous/anxious.    ? ?Physical Exam ?BP 120/70   Pulse 87   Temp 98.1 ?F (36.7 ?C) (  Oral)   Ht 4' 10"  (1.473 m)   Wt 115 lb 2 oz (52.2 kg)   SpO2 100%   BMI 24.06 kg/m?   ? ?BP Readings from Last 3 Encounters:  ?10/18/21 120/70  ?12/14/20 108/71  ?08/02/20 120/78  ? ? ? ? ?Physical Exam ?Constitutional:   ?   General: She is not in acute distress. ?   Appearance: She is well-developed. She is not diaphoretic.  ?HENT:  ?   Head: Normocephalic and atraumatic.  ?   Right Ear: External ear normal.  ?   Left Ear: External ear normal.  ?   Nose: Nose normal.  ?Eyes:  ?   General: No scleral icterus. ?   Extraocular Movements: Extraocular movements intact.  ?   Conjunctiva/sclera: Conjunctivae normal.  ?Cardiovascular:  ?   Rate and Rhythm: Normal rate and regular rhythm.  ?   Heart sounds: No murmur heard. ?Pulmonary:  ?   Effort: Pulmonary effort is normal. No respiratory distress.  ?   Breath sounds: Normal breath sounds. No wheezing.  ?Abdominal:  ?   General: Bowel sounds are normal. There is no distension.  ?   Palpations: Abdomen is soft. There is no mass.  ?   Tenderness: There is no abdominal tenderness. There is no guarding or rebound.  ?Musculoskeletal:     ?   General: Normal range of motion.  ?   Cervical back: Neck supple.  ?Lymphadenopathy:  ?   Cervical: No cervical adenopathy.  ?Skin: ?   General: Skin is warm and dry.  ?   Capillary Refill:  Capillary refill takes less than 2 seconds.  ?Neurological:  ?   Mental Status: She is alert and oriented to person, place, and time.  ?   Deep Tendon Reflexes: Reflexes normal.  ?Psychiatric:     ?   Mood and Affect: Moo

## 2022-02-08 ENCOUNTER — Encounter: Payer: Self-pay | Admitting: Family Medicine

## 2022-04-13 ENCOUNTER — Encounter: Payer: Self-pay | Admitting: Advanced Practice Midwife

## 2022-04-13 ENCOUNTER — Ambulatory Visit (INDEPENDENT_AMBULATORY_CARE_PROVIDER_SITE_OTHER): Payer: Commercial Managed Care - HMO | Admitting: Advanced Practice Midwife

## 2022-04-13 VITALS — BP 116/78 | HR 92 | Wt 113.0 lb

## 2022-04-13 DIAGNOSIS — Z789 Other specified health status: Secondary | ICD-10-CM

## 2022-04-13 DIAGNOSIS — Z3046 Encounter for surveillance of implantable subdermal contraceptive: Secondary | ICD-10-CM

## 2022-04-13 DIAGNOSIS — Z30017 Encounter for initial prescription of implantable subdermal contraceptive: Secondary | ICD-10-CM | POA: Diagnosis not present

## 2022-04-13 MED ORDER — ETONOGESTREL 68 MG ~~LOC~~ IMPL
68.0000 mg | DRUG_IMPLANT | Freq: Once | SUBCUTANEOUS | Status: AC
  Administered 2022-04-13: 68 mg via SUBCUTANEOUS

## 2022-04-13 NOTE — Progress Notes (Signed)
Patient presents for Nexplanon Removal and Re-insertion.  CC: None

## 2022-04-13 NOTE — Progress Notes (Addendum)
     GYNECOLOGY OFFICE PROCEDURE NOTE  Faryal Marxen Tanaya Dunigan is a 46 y.o. [phone] here for Nexplanon removal and Nexplanon insertion.  Last pap smear was on 12/09/2019 and was normal.  No other gynecologic concerns.  Nexplanon Removal and Reinsertion Patient identified, informed consent performed, consent signed.   Patient does understand that irregular bleeding is a very common side effect of this medication. She was advised to have backup contraception for one week after replacement of the implant. Pregnancy test in clinic today was negative.  Appropriate time out taken. Nexplanon site identified in left arm.  Area prepped in usual sterile fashon. One ml of 1% lidocaine was used to anesthetize the area at the distal end of the implant. A small stab incision was made right beside the implant on the distal portion. The Nexplanon rod was grasped using hemostats and removed without difficulty. There was minimal blood loss. There were no complications. Area was then injected with 3 ml of 1 % lidocaine. She was re-prepped with betadine, Nexplanon removed from packaging, Device confirmed in needle, then inserted full length of needle and withdrawn per handbook instructions. Nexplanon was able to palpated in the patient's arm; patient palpated the insert herself.  There was minimal blood loss. Patient insertion site covered with gauze and a pressure bandage to reduce any bruising. The patient tolerated the procedure well and was given post procedure instructions.  She was advised to have backup contraception for one week.     Patient to schedule well woman exam at her convenience.   Language barrier: interpreter present for all patient interaction.  Mallie Snooks, [place], MSN, CNM Certified Nurse Midwife, Product/process development scientist for [employer], [place]

## 2022-05-29 ENCOUNTER — Other Ambulatory Visit: Payer: Self-pay | Admitting: Obstetrics & Gynecology

## 2022-05-29 DIAGNOSIS — Z1231 Encounter for screening mammogram for malignant neoplasm of breast: Secondary | ICD-10-CM

## 2022-06-01 ENCOUNTER — Ambulatory Visit (INDEPENDENT_AMBULATORY_CARE_PROVIDER_SITE_OTHER): Payer: Commercial Managed Care - HMO | Admitting: Obstetrics and Gynecology

## 2022-06-01 ENCOUNTER — Encounter: Payer: Self-pay | Admitting: Obstetrics and Gynecology

## 2022-06-01 VITALS — BP 118/74 | HR 90 | Wt 117.0 lb

## 2022-06-01 DIAGNOSIS — Z01419 Encounter for gynecological examination (general) (routine) without abnormal findings: Secondary | ICD-10-CM | POA: Diagnosis not present

## 2022-06-01 DIAGNOSIS — Z975 Presence of (intrauterine) contraceptive device: Secondary | ICD-10-CM | POA: Diagnosis not present

## 2022-06-01 DIAGNOSIS — R81 Glycosuria: Secondary | ICD-10-CM | POA: Diagnosis not present

## 2022-06-01 DIAGNOSIS — R3915 Urgency of urination: Secondary | ICD-10-CM | POA: Diagnosis not present

## 2022-06-01 LAB — POCT URINALYSIS DIPSTICK
Bilirubin, UA: NEGATIVE
Blood, UA: NEGATIVE
Glucose, UA: POSITIVE — AB
Ketones, UA: NEGATIVE
Leukocytes, UA: NEGATIVE
Nitrite, UA: NEGATIVE
Protein, UA: NEGATIVE
Spec Grav, UA: 1.01 (ref 1.010–1.025)
Urobilinogen, UA: 0.2 E.U./dL
pH, UA: 5 (ref 5.0–8.0)

## 2022-06-01 NOTE — Progress Notes (Signed)
Annual  Last pap: 12/09/2019 WNL  Mammogram:02/10/21 WNL Scheduled for 07/2022. STD Screening : Declined  Contraception: Nexplanon   WG:[id number] for 2 yrs .   no pain no burning. Pt asked to leave sample

## 2022-06-01 NOTE — Progress Notes (Signed)
Obstetrics and Gynecology Annual Patient Evaluation  Appointment Date: 06/01/2022  OBGYN Clinic: [facility]  Primary Care Provider: Gweneth Dimitri  Chief Complaint:  Chief Complaint  Patient presents with   Gynecologic Exam    History of Present Illness: Olivia [name] is a 46 y.o. Asian [id number] (LMP: six months ago), seen for the above chief complaint. Her past medical history is significant for nothing   Only complain is increased urinary urgency but no other lower urinary tract s/s.   Review of Systems: Pertinent items noted in HPI and remainder of comprehensive ROS otherwise negative.    Patient Active Problem List   Diagnosis Date Noted   Mixed hyperlipidemia 10/18/2021   Lightheadedness 10/18/2021   Nexplanon in place since 03/2022 12/09/2019    Past Medical History:  Past Medical History:  Diagnosis Date   Other fatigue 08/02/2020   Seasonal allergies 08/02/2020    Past Surgical History:  No past surgical history on file.  Past Obstetrical History:  OB History  Gravida Para Term Preterm AB Living  3 3 3     3   SAB IAB Ectopic Multiple Live Births          3    # Outcome Date GA Lbr Len/2nd Weight Sex Delivery Anes PTL Lv  3 Term 2012     Vag-Spont     2 Term 2005     Vag-Spont     1 Term 2002     Vag-Spont      Past Gynecological History: As per HPI. Periods: rare History of Pap Smear(s): Yes.   Last pap 2021, which was negative cytology and hpv She is currently using  nexplanon (placed 03/2022)  for contraception.   Social History:  Social History   Socioeconomic History   Marital status: Married    Spouse name: Savonglam   Number of children: 3   Years of education: high school   Highest education level: Not on file  Occupational History   Not on file  Tobacco Use   Smoking status: Never   Smokeless tobacco: Never  Vaping Use   Vaping Use: Never used  Substance and Sexual Activity   Alcohol use: Yes     Comment: occasional   Drug use: Not Currently   Sexual activity: Yes    Birth control/protection: Implant  Other Topics Concern   Not on file  Social History Narrative   08/02/20   From: [place], moved 2011   Living: with husband, Sovong lam (2011) and 3 children   Work: 08-07-1986       Family: 3 children - Taam (2002), 01-24-2003 (2005, 06-03-1977 (2012)       Enjoys: go to 07-04-2002      Exercise: not currently   Diet: home cooked, variety of meat and veggies      Safety   Seat belts: Yes    Guns: Yes  and secure   Safe in relationships: Yes    Social Determinants of [employer] Strain: Not on file  Food Insecurity: Not on file  Transportation Needs: Not on file  Physical Activity: Not on file  Stress: Not on file  Social Connections: Not on file  Intimate Partner Violence: Not on file    Family History:  Family History  Problem Relation Age of Onset   Cervical cancer Mother 26   Breast cancer Maternal Aunt 49   Cervical cancer Maternal Aunt 45   Heart  attack Maternal Grandfather     Health Maintenance:  Mammogram(s): Yes.   Date: negative 01/2021  Medications Olivia [name] had no medications administered during this visit. Current Outpatient Medications  Medication Sig Dispense Refill   etonogestrel (NEXPLANON) 68 MG IMPL implant Nexplanon 68 mg subdermal implant  Inject by subcutaneous route. Due for removal in 3 years     loratadine (CLARITIN) 10 MG tablet Take 10 mg by mouth daily.     No current facility-administered medications for this visit.    Allergies Patient has no known allergies.   Physical Exam:  BP 118/74   Pulse 90   Wt 117 lb (53.1 kg)   BMI 24.45 kg/m  Body mass index is 24.45 kg/m.  General appearance: Well nourished, well developed female in no acute distress.  Neck:  Supple, normal appearance, and no thyromegaly  Cardiovascular: normal s1 and s2.  No murmurs, rubs or gallops. Respiratory:  Clear to auscultation  bilateral. Normal respiratory effort Abdomen: positive bowel sounds and no masses, hernias; diffusely non tender to palpation, non distended Breasts: breasts appear normal, no suspicious masses, no skin or nipple changes or axillary nodes, and normal palpation. Neuro/Psych:  Normal mood and affect.  Skin:  Warm and dry.  Lymphatic:  No inguinal lymphadenopathy.   Pelvic exam: is not limited by body habitus EGBUS: within normal limits Vagina: within normal limits and with no blood or discharge in the vault Cervix: normal appearing cervix without tenderness, discharge or lesions. Uterus:  nonenlarged and non tender Adnexa:  normal adnexa and no mass, fullness, tenderness Rectovaginal: deferred  Laboratory: ua dip negative except slight glucose  Radiology: none  Assessment: patient doing well  Plan:  1. Urgency of urination - POCT Urinalysis Dipstick - Urine Culture - Urinalysis, Routine w reflex microscopic  2. Well woman exam Pap next year  3. Nexplanon in place since 03/2022 No issues  RTC 1 year  Joppa Bing, Montez Hageman MD Attending [facility] for [facility] Midwife)

## 2022-06-02 LAB — MICROSCOPIC EXAMINATION
Bacteria, UA: NONE SEEN
Casts: NONE SEEN /lpf
RBC, Urine: NONE SEEN /hpf (ref 0–2)
WBC, UA: NONE SEEN /hpf (ref 0–5)

## 2022-06-02 LAB — URINALYSIS, ROUTINE W REFLEX MICROSCOPIC
Bilirubin, UA: NEGATIVE
Ketones, UA: NEGATIVE
Nitrite, UA: NEGATIVE
Protein,UA: NEGATIVE
RBC, UA: NEGATIVE
Specific Gravity, UA: 1.007 (ref 1.005–1.030)
Urobilinogen, Ur: 0.2 mg/dL (ref 0.2–1.0)
pH, UA: 6.5 (ref 5.0–7.5)

## 2022-06-03 LAB — URINE CULTURE: Organism ID, Bacteria: NO GROWTH

## 2022-06-05 ENCOUNTER — Encounter: Payer: Self-pay | Admitting: Obstetrics and Gynecology

## 2022-06-05 DIAGNOSIS — R81 Glycosuria: Secondary | ICD-10-CM | POA: Insufficient documentation

## 2022-06-05 NOTE — Addendum Note (Signed)
Addended by: [place] Bing on: 06/05/2022 02:25 PM   Modules accepted: Orders

## 2022-06-09 ENCOUNTER — Other Ambulatory Visit: Payer: Self-pay | Admitting: *Deleted

## 2022-06-09 ENCOUNTER — Telehealth: Payer: Self-pay | Admitting: *Deleted

## 2022-06-09 NOTE — Telephone Encounter (Signed)
-----   Message from H[place]ory Bing, MD sent at 06/05/2022  2:24 PM EST ----- Can you let her know that her urine showed no signs of a bladder infection but there was some sugar in there? I would like for her to come in and repeat her urine test and get an A1c.  thanks

## 2022-06-09 NOTE — Telephone Encounter (Signed)
Spoke with pts husband about results and Dr Vergie Living wants to repeat urine test and do a blood test. Pt will come in Monday

## 2022-06-09 NOTE — Progress Notes (Signed)
erroneous

## 2022-06-12 ENCOUNTER — Other Ambulatory Visit: Payer: Commercial Managed Care - HMO

## 2022-06-12 DIAGNOSIS — R3915 Urgency of urination: Secondary | ICD-10-CM

## 2022-06-12 DIAGNOSIS — R81 Glycosuria: Secondary | ICD-10-CM

## 2022-06-12 NOTE — Progress Notes (Deleted)
Pt here for Repeat UA thorough lacorp and labs.

## 2022-06-13 ENCOUNTER — Telehealth: Payer: Self-pay | Admitting: *Deleted

## 2022-06-13 LAB — URINALYSIS, ROUTINE W REFLEX MICROSCOPIC
Bilirubin, UA: NEGATIVE
Glucose, UA: NEGATIVE
Ketones, UA: NEGATIVE
Leukocytes,UA: NEGATIVE
Nitrite, UA: NEGATIVE
Protein,UA: NEGATIVE
RBC, UA: NEGATIVE
Specific Gravity, UA: <=1.005 — AB (ref 1.005–1.030)
Urobilinogen, Ur: 0.2 mg/dL (ref 0.2–1.0)
pH, UA: 6.5 (ref 5.0–7.5)

## 2022-06-13 LAB — HEMOGLOBIN A1C
Est. average glucose Bld gHb Est-mCnc: 111 mg/dL
Hgb A1c MFr Bld: 5.5 % (ref 4.8–5.6)

## 2022-06-13 NOTE — Telephone Encounter (Signed)
Called pt spouse and informed him that his wife results where all normal

## 2022-06-13 NOTE — Telephone Encounter (Signed)
-----   Message from Montague Bing, MD sent at 06/13/2022 11:51 AM EST ----- Can you let her know that everything came back normal? thanks

## 2022-08-01 ENCOUNTER — Ambulatory Visit
Admission: RE | Admit: 2022-08-01 | Discharge: 2022-08-01 | Disposition: A | Discharge status: Home / Self Care | Payer: 59 | Source: Ambulatory Visit | Attending: Obstetrics & Gynecology | Admitting: Obstetrics & Gynecology

## 2022-08-01 DIAGNOSIS — Z1231 Encounter for screening mammogram for malignant neoplasm of breast: Secondary | ICD-10-CM

## 2022-08-02 ENCOUNTER — Other Ambulatory Visit: Payer: Self-pay | Admitting: Obstetrics & Gynecology

## 2022-08-02 DIAGNOSIS — R928 Other abnormal and inconclusive findings on diagnostic imaging of breast: Secondary | ICD-10-CM

## 2022-08-10 ENCOUNTER — Ambulatory Visit
Admission: RE | Admit: 2022-08-10 | Discharge: 2022-08-10 | Disposition: A | Discharge status: Home / Self Care | Payer: 59 | Source: Ambulatory Visit | Attending: Obstetrics & Gynecology | Admitting: Obstetrics & Gynecology

## 2022-08-10 ENCOUNTER — Ambulatory Visit: Payer: 59

## 2022-08-10 DIAGNOSIS — N6489 Other specified disorders of breast: Secondary | ICD-10-CM | POA: Diagnosis not present

## 2022-08-10 DIAGNOSIS — R928 Other abnormal and inconclusive findings on diagnostic imaging of breast: Secondary | ICD-10-CM

## 2022-10-04 IMAGING — MG MM DIGITAL SCREENING BILAT W/ TOMO AND CAD
8 series · 9 of 24 positions shown · non-contrast
Comparison: Previous exam(s).

CLINICAL DATA: Screening.

EXAM:
DIGITAL SCREENING BILATERAL MAMMOGRAM WITH TOMOSYNTHESIS AND CAD
TECHNIQUE: Bilateral screening digital craniocaudal and mediolateral oblique
mammograms were obtained. Bilateral screening digital breast
tomosynthesis was performed. The images were evaluated with
computer-aided detection.

[L MLO synth-2D]
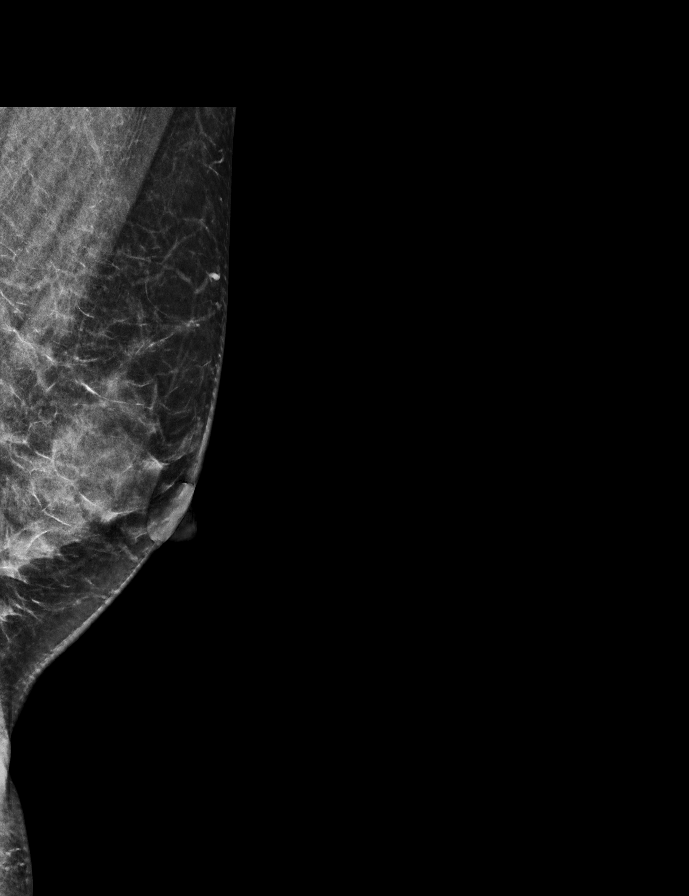

[R MLO synth-2D]
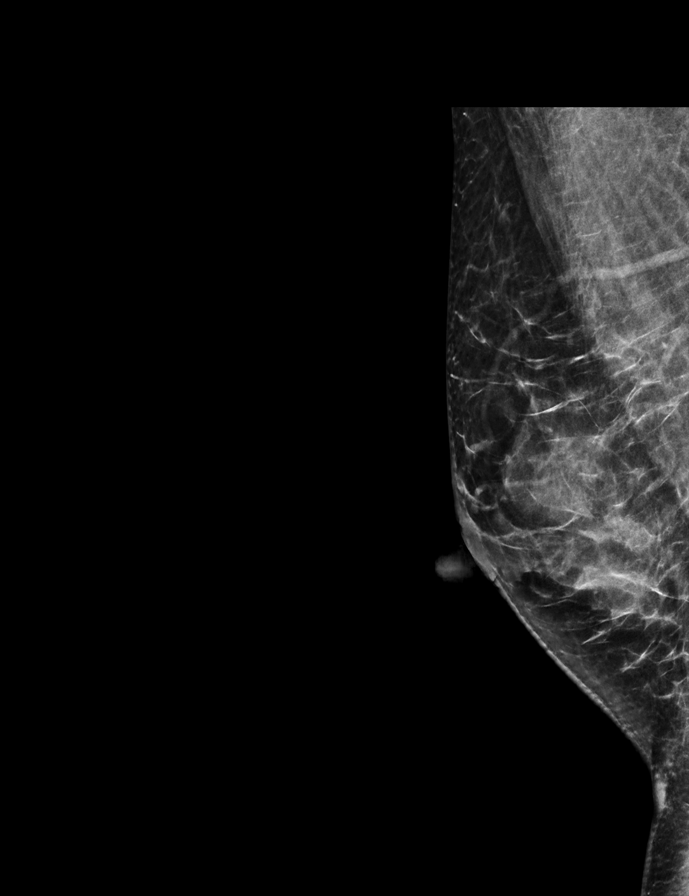

[L CC synth-2D]
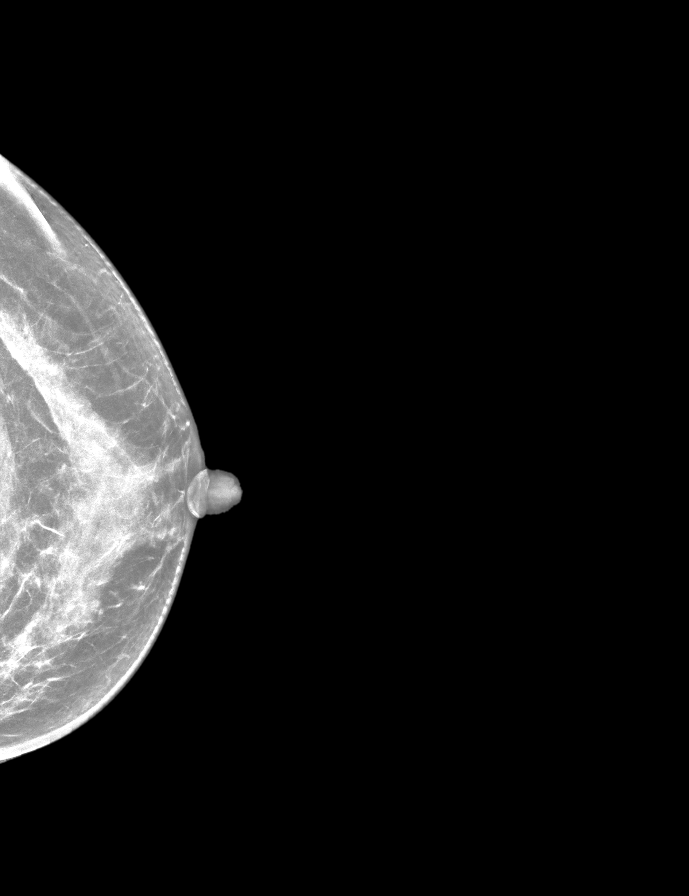

[R CC synth-2D]
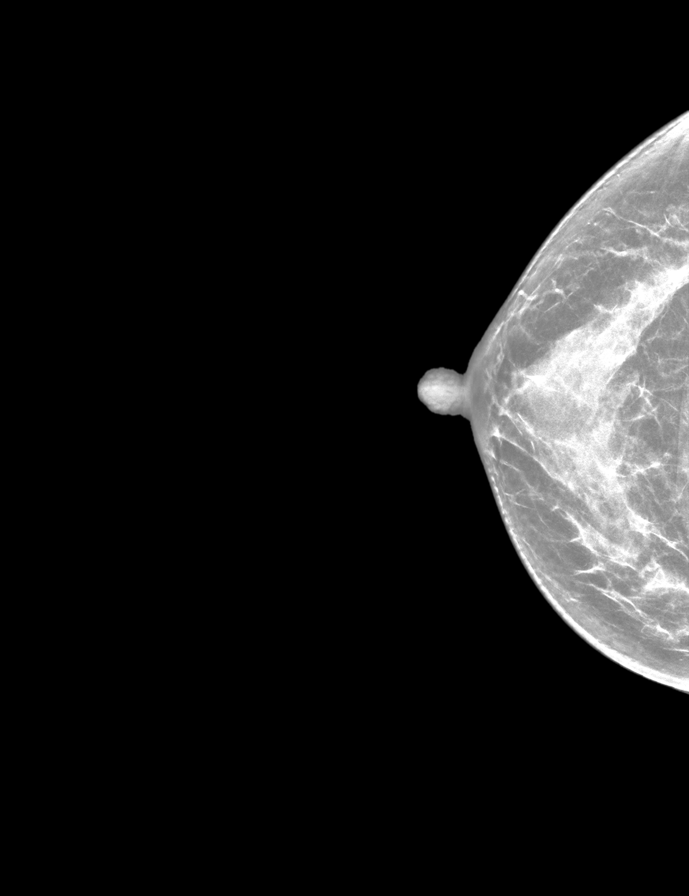

[R MLO tomo · 2 of 62 frames shown]
[frame 21/62]
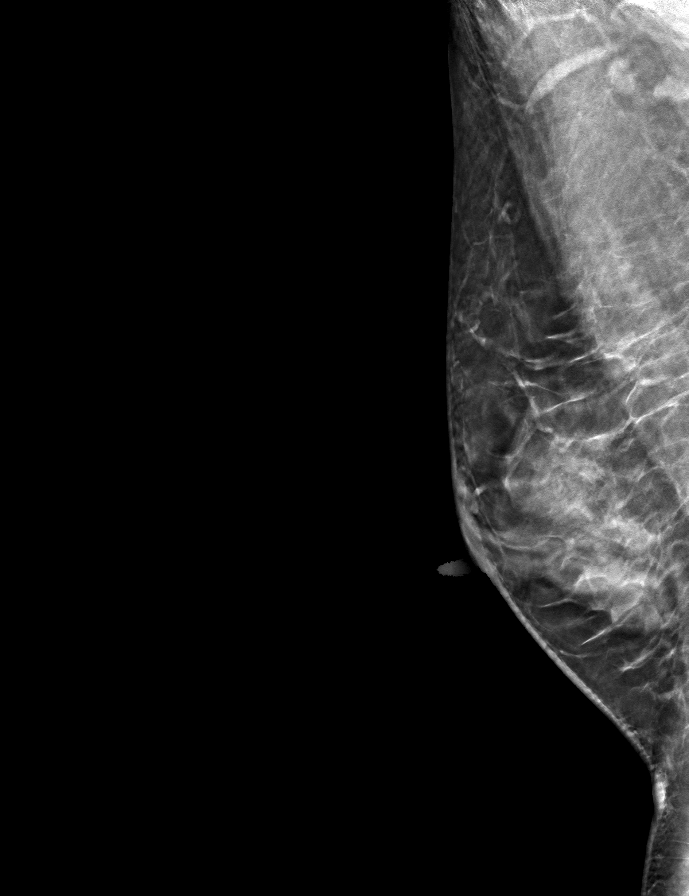
[frame 31/62]
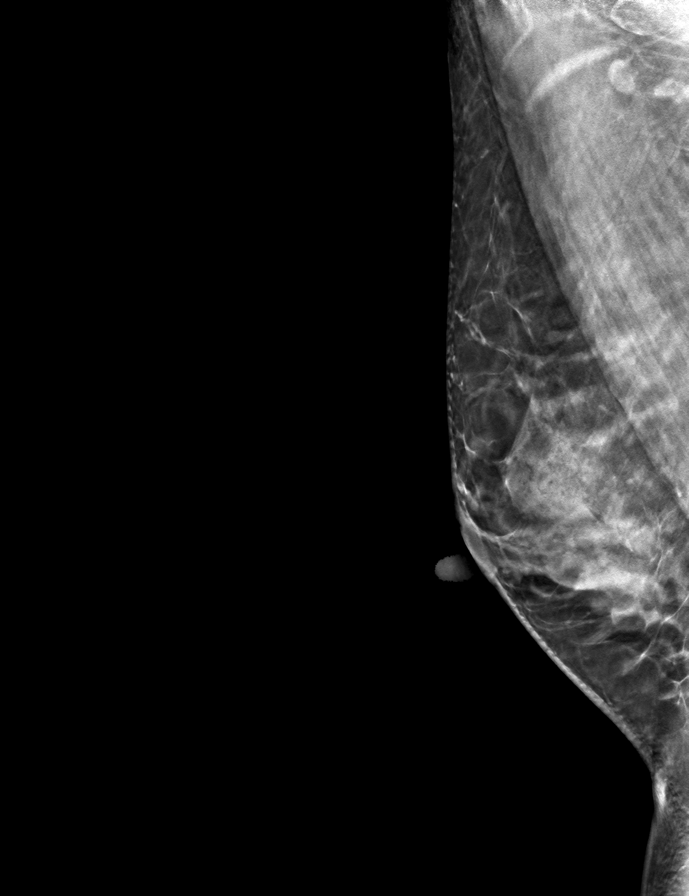

[L CC tomo · tomo slice 29/57.0]
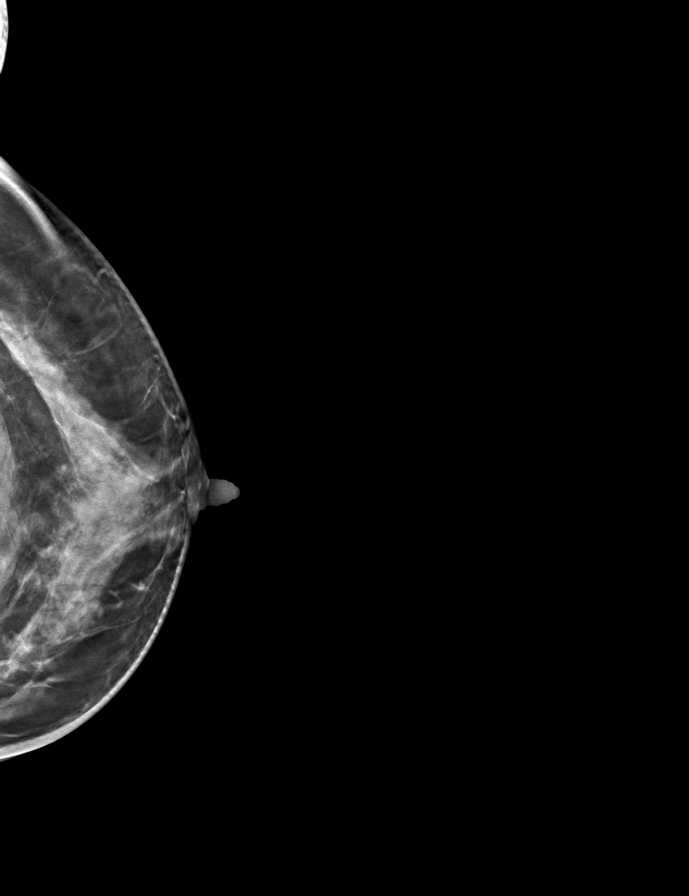

[R CC tomo · tomo slice 29/56.0]
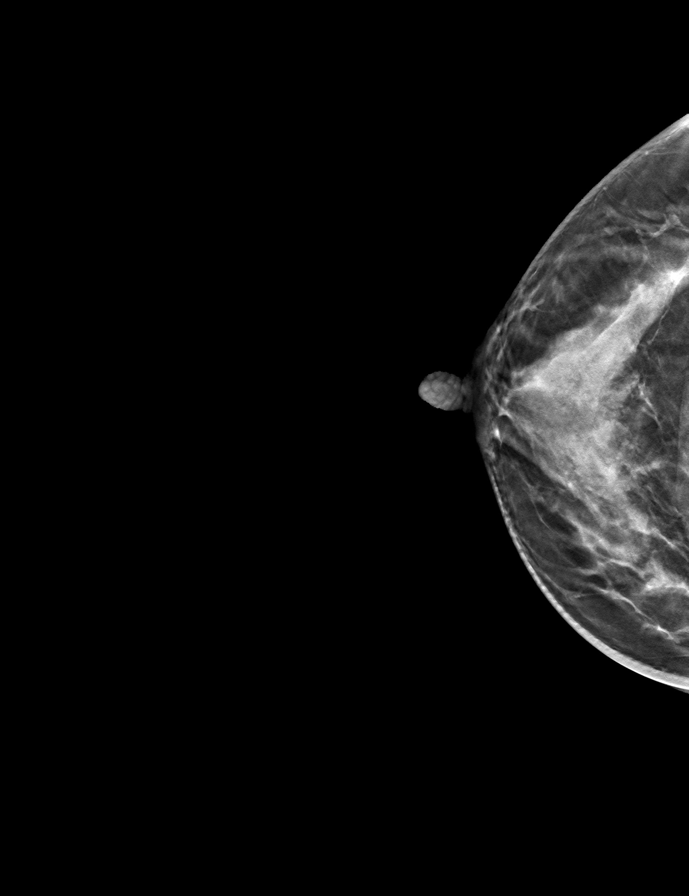

[L MLO tomo · tomo slice 30/59.0]
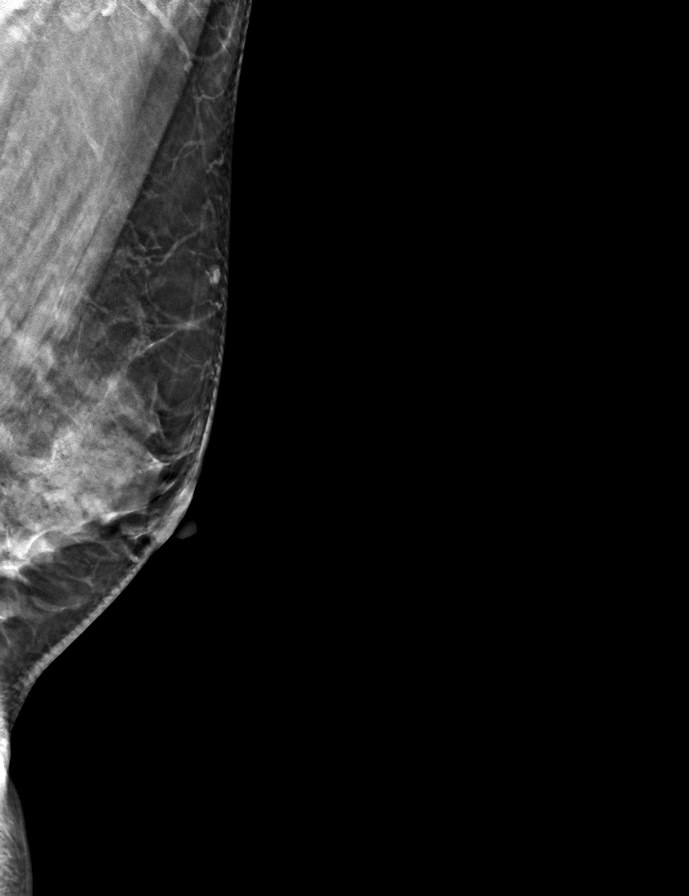

[9 of 24 positions shown; findings below may reference images not displayed]

ACR Breast Density Category c: The breast tissue is heterogeneously
dense, which may obscure small masses.
FINDINGS: There are no findings suspicious for malignancy.
IMPRESSION: No mammographic evidence of malignancy. A result letter of this
screening mammogram will be mailed directly to the patient.

RECOMMENDATION:
Screening mammogram in one year. (Code:Q3-W-BC3)

BI-RADS CATEGORY  1: Negative.

## 2022-10-13 ENCOUNTER — Other Ambulatory Visit: Payer: 59

## 2022-10-24 ENCOUNTER — Encounter: Payer: 59 | Admitting: Family Medicine

## 2022-12-28 ENCOUNTER — Encounter: Payer: 59 | Admitting: Family

## 2023-03-08 ENCOUNTER — Ambulatory Visit: Payer: 59 | Attending: Family

## 2023-03-08 ENCOUNTER — Encounter: Payer: Self-pay | Admitting: Family

## 2023-03-08 ENCOUNTER — Ambulatory Visit: Payer: 59 | Admitting: Family

## 2023-03-08 VITALS — BP 126/82 | HR 111 | Temp 97.5°F | Ht <= 58 in | Wt 119.0 lb

## 2023-03-08 DIAGNOSIS — E782 Mixed hyperlipidemia: Secondary | ICD-10-CM

## 2023-03-08 DIAGNOSIS — R7989 Other specified abnormal findings of blood chemistry: Secondary | ICD-10-CM | POA: Insufficient documentation

## 2023-03-08 DIAGNOSIS — R9431 Abnormal electrocardiogram [ECG] [EKG]: Secondary | ICD-10-CM | POA: Diagnosis not present

## 2023-03-08 DIAGNOSIS — R5383 Other fatigue: Secondary | ICD-10-CM | POA: Diagnosis not present

## 2023-03-08 DIAGNOSIS — R002 Palpitations: Secondary | ICD-10-CM

## 2023-03-08 DIAGNOSIS — Z0001 Encounter for general adult medical examination with abnormal findings: Secondary | ICD-10-CM

## 2023-03-08 DIAGNOSIS — Z1211 Encounter for screening for malignant neoplasm of colon: Secondary | ICD-10-CM

## 2023-03-08 LAB — CBC
HCT: 37.9 % (ref 36.0–46.0)
Hemoglobin: 12.1 g/dL (ref 12.0–15.0)
MCHC: 32 g/dL (ref 30.0–36.0)
MCV: 85.7 fl (ref 78.0–100.0)
Platelets: 305 10*3/uL (ref 150.0–400.0)
RBC: 4.42 Mil/uL (ref 3.87–5.11)
RDW: 12.4 % (ref 11.5–15.5)
WBC: 6.9 10*3/uL (ref 4.0–10.5)

## 2023-03-08 LAB — BASIC METABOLIC PANEL
BUN: 12 mg/dL (ref 6–23)
CO2: 28 mEq/L (ref 19–32)
Calcium: 10 mg/dL (ref 8.4–10.5)
Chloride: 101 mEq/L (ref 96–112)
Creatinine, Ser: 0.59 mg/dL (ref 0.40–1.20)
GFR: 107.93 mL/min (ref >60.00)
Glucose, Bld: 95 mg/dL (ref 70–99)
Potassium: 3.8 mEq/L (ref 3.5–5.1)
Sodium: 137 mEq/L (ref 135–145)

## 2023-03-08 LAB — IBC + FERRITIN
Ferritin: 61.7 ng/mL (ref 10.0–291.0)
Iron: 70 ug/dL (ref 42–145)
Saturation Ratios: 15.5 % — ABNORMAL LOW (ref 20.0–50.0)
TIBC: 452.2 ug/dL — ABNORMAL HIGH (ref 250.0–450.0)
Transferrin: 323 mg/dL (ref 212.0–360.0)

## 2023-03-08 LAB — LIPID PANEL
Cholesterol: 230 mg/dL — ABNORMAL HIGH (ref 0–200)
HDL: 53.4 mg/dL (ref >39.00)
LDL Cholesterol: 142 mg/dL — ABNORMAL HIGH (ref 0–99)
NonHDL: 176.56
Total CHOL/HDL Ratio: 4
Triglycerides: 175 mg/dL — ABNORMAL HIGH (ref 0.0–149.0)
VLDL: 35 mg/dL (ref 0.0–40.0)

## 2023-03-08 LAB — T4, FREE: Free T4: 0.74 ng/dL (ref 0.60–1.60)

## 2023-03-08 LAB — T3, FREE: T3, Free: 3.8 pg/mL (ref 2.3–4.2)

## 2023-03-08 LAB — VITAMIN D 25 HYDROXY (VIT D DEFICIENCY, FRACTURES): VITD: 21.35 ng/mL — ABNORMAL LOW (ref 30.00–100.00)

## 2023-03-08 LAB — TSH: TSH: 1.18 u[IU]/mL (ref 0.35–5.50)

## 2023-03-08 LAB — VITAMIN B12: Vitamin B-12: 228 pg/mL (ref 211–911)

## 2023-03-08 NOTE — Assessment & Plan Note (Addendum)
Tsh and cmp today  EKG in office , NSR  Zio monitor ordered to monitor rhythm. Once results received may consider cardiology referral.  Pending results.  Limit caffeine

## 2023-03-08 NOTE — Assessment & Plan Note (Signed)
Ordered vitamin d pending results.   

## 2023-03-08 NOTE — Assessment & Plan Note (Signed)
Ordered lipid panel, pending results. Work on low cholesterol diet and exercise as tolerated  

## 2023-03-08 NOTE — Progress Notes (Addendum)
Established Patient Office Visit  Subjective:  Patient ID: Olivia [name], female    DOB: 12-04-1975  Age: 47 y.o. MRN: [id number]  CC:  Chief Complaint  Patient presents with   Establish Care    TOC from Dr. Selena Batten     HPI Brandon Thi Henretter Chakrabarti is here for a transition of care visit as well as here for annual exam. Accompanied by a [place] translator.  .  Prior provider was: Dr. Gweneth Dimitri   Has nexplanon in place, sees gynecology in [place].   Pt is with acute concerns.  At times with fatigue and then will feel palpitations, she does drink caffeine as well but doesn't notice this in relation to the caffeine intake. She states this happens about three times a month which worries her that she might have a heart problem. When it happens she feels something is 'pricking at her heart' lasts for about one or two minutes.   At times will notice a sore throat, and also will feel fullness in her throat at times.   Colonoscopy:  Utd on her eye exams and dental exams No known h/o STD  General diet.  Exercise: not really with a routine No advanced directive or a living will     Past Medical History:  Diagnosis Date   Other fatigue 08/02/2020   Seasonal allergies 08/02/2020    Past Surgical History:  Procedure Laterality Date   NO PAST SURGERIES      Family History  Problem Relation Age of Onset   Cervical cancer Mother 79   Stroke Father    Hypertension Father    Heart attack Maternal Grandfather 82   Breast cancer Maternal Aunt 49   Breast cancer Maternal Aunt    Cervical cancer Maternal Aunt 4    Social History   Socioeconomic History   Marital status: Married    Spouse name: Savonglam   Number of children: 3   Years of education: high school   Highest education level: Not on file  Occupational History    Employer: [employer]  Tobacco Use   Smoking status: Never   Smokeless tobacco: Never  Vaping Use   Vaping status: Never Used  Substance and  Sexual Activity   Alcohol use: Yes    Comment: occasional   Drug use: Not Currently   Sexual activity: Yes    Partners: Male    Birth control/protection: Implant  Other Topics Concern   Not on file  Social History Narrative   08/02/20   From: [place], moved 2011   Living: with husband, Sovong lam (2011) and 3 children   Work: Chief Strategy Officer       Family: 3 children - Taam (2002), Charlton Haws (2005, Melanee Spry (2012)       Enjoys: go to [employer]      Exercise: not currently   Diet: home cooked, variety of meat and veggies      Safety   Seat belts: Yes    Guns: Yes  and secure   Safe in relationships: Yes    Social Determinants of Corporate investment banker Strain: Not on file  Food Insecurity: Not on file  Transportation Needs: Not on file  Physical Activity: Not on file  Stress: Not on file  Social Connections: Not on file  Intimate Partner Violence: Not on file    Outpatient Medications Prior to Visit  Medication Sig Dispense Refill   etonogestrel (NEXPLANON) 68 MG IMPL implant Nexplanon  68 mg subdermal implant  Inject by subcutaneous route. Due for removal in 3 years     loratadine (CLARITIN) 10 MG tablet Take 10 mg by mouth daily.     No facility-administered medications prior to visit.    No Known Allergies  ROS: Pertinent symptoms negative unless otherwise noted in HPI      Objective:    Physical Exam Vitals reviewed.  Constitutional:      General: She is not in acute distress.    Appearance: Normal appearance. She is not ill-appearing or toxic-appearing.  HENT:     Right Ear: Tympanic membrane normal.     Left Ear: Tympanic membrane normal.     Mouth/Throat:     Mouth: Mucous membranes are moist.     Pharynx: No pharyngeal swelling.     Tonsils: No tonsillar exudate.  Eyes:     Extraocular Movements: Extraocular movements intact.     Conjunctiva/sclera: Conjunctivae normal.     Pupils: Pupils are equal, round, and reactive to light.  Neck:     Thyroid:  No thyroid mass.  Cardiovascular:     Rate and Rhythm: Normal rate and regular rhythm.  Pulmonary:     Effort: Pulmonary effort is normal.     Breath sounds: Normal breath sounds.  Abdominal:     General: Abdomen is flat. Bowel sounds are normal.     Palpations: Abdomen is soft.  Musculoskeletal:        General: Normal range of motion.  Lymphadenopathy:     Cervical:     Right cervical: No superficial cervical adenopathy.    Left cervical: No superficial cervical adenopathy.  Skin:    General: Skin is warm.     Capillary Refill: Capillary refill takes less than 2 seconds.  Neurological:     General: No focal deficit present.     Mental Status: She is alert and oriented to person, place, and time.  Psychiatric:        Mood and Affect: Mood normal.        Behavior: Behavior normal.        Thought Content: Thought content normal.        Judgment: Judgment normal.       BP 126/82   Pulse (!) 111   Temp (!) 97.5 F (36.4 C) (Temporal)   Ht 4\' 10"  (1.473 m)   Wt 119 lb (54 kg)   SpO2 98%   BMI 24.87 kg/m  Wt Readings from Last 3 Encounters:  03/08/23 119 lb (54 kg)  06/01/22 117 lb (53.1 kg)  04/13/22 113 lb (51.3 kg)     Health Maintenance Due  Topic Date Due   Colonoscopy  Never done   COVID-19 Vaccine (4 - 2023-24 season) 03/24/2022    There are no preventive care reminders to display for this patient.  Lab Results  Component Value Date   TSH 1.38 10/18/2021   Lab Results  Component Value Date   WBC 9.0 10/18/2021   HGB 12.3 10/18/2021   HCT 37.8 10/18/2021   MCV 85.8 10/18/2021   PLT 302.0 10/18/2021   Lab Results  Component Value Date   NA 138 10/18/2021   K 4.0 10/18/2021   CO2 30 10/18/2021   GLUCOSE 90 10/18/2021   BUN 14 10/18/2021   CREATININE 0.56 10/18/2021   BILITOT 0.4 10/18/2021   ALKPHOS 40 10/18/2021   AST 14 10/18/2021   ALT 10 10/18/2021   PROT 7.5 10/18/2021   ALBUMIN 4.6  10/18/2021   CALCIUM 9.7 10/18/2021   GFR 110.36  10/18/2021   Lab Results  Component Value Date   CHOL 207 (H) 10/18/2021   Lab Results  Component Value Date   HDL 54.10 10/18/2021   Lab Results  Component Value Date   LDLCALC 114 (H) 10/18/2021   Lab Results  Component Value Date   TRIG 193.0 (H) 10/18/2021   Lab Results  Component Value Date   CHOLHDL 4 10/18/2021   Lab Results  Component Value Date   HGBA1C 5.5 06/12/2022      Assessment & Plan:   Other fatigue Assessment & Plan: Workup with b12 tsh and cbc to r/o anemia, thyroid disease and or low b12 pending results  Orders: -     TSH -     T4, free -     T3, free -     CBC -     IBC + Ferritin -     Basic metabolic panel -     EKG 12-Lead -     Vitamin B12  Palpitations Assessment & Plan: Tsh and cmp today  EKG in office , NSR  Zio monitor ordered to monitor rhythm. Once results received may consider cardiology referral.  Pending results.  Limit caffeine   Orders: -     TSH -     T4, free -     T3, free -     CBC -     IBC + Ferritin -     Basic metabolic panel -     EKG 12-Lead -     LONG TERM MONITOR (3-14 DAYS); Future  Mixed hyperlipidemia Assessment & Plan: Ordered lipid panel, pending results. Work on low cholesterol diet and exercise as tolerated   Orders: -     Lipid panel  Low vitamin D level Assessment & Plan: Ordered vitamin d pending results.    Orders: -     VITAMIN D 25 Hydroxy (Vit-D Deficiency, Fractures)  Screening for colon cancer -     Ambulatory referral to Gastroenterology  Encounter for general adult medical examination with abnormal findings Assessment & Plan: Patient Counseling(The following topics were reviewed):  Preventative care handout given to pt  Health maintenance and immunizations reviewed. Please refer to Health maintenance section. Pt advised on safe sex, wearing seatbelts in car, and proper nutrition labwork ordered today for annual Dental health: Discussed importance of regular tooth  brushing, flossing, and dental visits.    Abnormal EKG -     LONG TERM MONITOR (3-14 DAYS); Future    No orders of the defined types were placed in this encounter.   Follow-up: Return in about 1 year (around 03/07/2024) for f/u CPE.    Mort Sawyers, FNP

## 2023-03-08 NOTE — Assessment & Plan Note (Signed)
Workup with b12 tsh and cbc to r/o anemia, thyroid disease and or low b12 pending results

## 2023-03-08 NOTE — Assessment & Plan Note (Signed)

## 2023-03-08 NOTE — Progress Notes (Signed)
NSR  Ordered zio monitor

## 2023-03-09 ENCOUNTER — Telehealth: Payer: Self-pay

## 2023-03-09 ENCOUNTER — Other Ambulatory Visit: Payer: Self-pay

## 2023-03-09 DIAGNOSIS — Z1211 Encounter for screening for malignant neoplasm of colon: Secondary | ICD-10-CM

## 2023-03-09 MED ORDER — NA SULFATE-K SULFATE-MG SULF 17.5-3.13-1.6 GM/177ML PO SOLN: 1.0000 | Freq: Once | ORAL | 0 refills | Status: AC

## 2023-03-09 NOTE — Telephone Encounter (Signed)
Gastroenterology Pre-Procedure Review  Request Date: 04/30/23 Requesting Physician: Dr. Allegra Lai  PATIENT REVIEW QUESTIONS: The patient''s husband Savong (DPR Checked)  responded to the following health history questions as indicated:    1. Are you having any GI issues? no 2. Do you have a personal history of Polyps? no 3. Do you have a family history of Colon Cancer or Polyps? no 4. Diabetes Mellitus? no 5. Joint replacements in the past 12 months?no 6. Major health problems in the past 3 months?no 7. Any artificial heart valves, MVP, or defibrillator?no    MEDICATIONS & ALLERGIES:    Patient reports the following regarding taking any anticoagulation/antiplatelet therapy:   Plavix, Coumadin, Eliquis, Xarelto, Lovenox, Pradaxa, Brilinta, or Effient? no Aspirin? no  Patient confirms/reports the following medications:  Current Outpatient Medications  Medication Sig Dispense Refill   etonogestrel (NEXPLANON) 68 MG IMPL implant Nexplanon 68 mg subdermal implant  Inject by subcutaneous route. Due for removal in 3 years     loratadine (CLARITIN) 10 MG tablet Take 10 mg by mouth daily.     No current facility-administered medications for this visit.    Patient confirms/reports the following allergies:  No Known Allergies  No orders of the defined types were placed in this encounter.   AUTHORIZATION INFORMATION Primary Insurance: 1D#: Group #:  Secondary Insurance: 1D#: Group #:  SCHEDULE INFORMATION: Date: 04/30/23 Time: Location: [facility]

## 2023-03-09 NOTE — Progress Notes (Signed)
Your vitamin D was a bit on the lower range. I will send an RX for vitamin D3 50,000 IU which you will take once weekly for 8 weeks. Once RX is complete, please continue over the counter Vitamin D3 2000 IU once daily.   Cholesterol was higher than desired. Work on low cholesterol diet exercise as tolerated. Goal LDL is 142, this is the bad cholesterol.   Iron is on the lower end. Start over the counter iron supplement and take every other day.  B12 also lower end, can contribute to feeling tired. Start over the counter 500 mcg once daily b12.

## 2023-03-20 DIAGNOSIS — R9431 Abnormal electrocardiogram [ECG] [EKG]: Secondary | ICD-10-CM

## 2023-03-20 DIAGNOSIS — R002 Palpitations: Secondary | ICD-10-CM | POA: Diagnosis not present

## 2023-04-04 DIAGNOSIS — R002 Palpitations: Secondary | ICD-10-CM | POA: Diagnosis not present

## 2023-04-04 DIAGNOSIS — R9431 Abnormal electrocardiogram [ECG] [EKG]: Secondary | ICD-10-CM | POA: Diagnosis not present

## 2023-04-12 ENCOUNTER — Other Ambulatory Visit: Payer: Self-pay | Admitting: Family

## 2023-04-12 DIAGNOSIS — I4949 Other premature depolarization: Secondary | ICD-10-CM

## 2023-04-12 NOTE — Progress Notes (Signed)
Rare SVT and ventricular ectopy. Suspect this is when pt is experiencing the palpitations and 'pricks at heart'. Will send to cardiology to evaluate further. Might need stress test. HR 60-149.  Limit caffeine intake.   Putting in cardiology referral today.  Please let us know if you have not heard back within 2 weeks about the referral.

## 2023-04-18 ENCOUNTER — Encounter: Payer: Self-pay | Admitting: Family

## 2023-04-30 ENCOUNTER — Telehealth: Payer: Self-pay | Admitting: *Deleted

## 2023-04-30 NOTE — Telephone Encounter (Signed)
Received a call from endo unit. Patient's spouse stated that patient's colonoscopy is supposed to be on 05/31/2023.  However, everything in the system stated 04/30/2023. Per request, we have reschedule patient to 05/31/2023.  New instructions will be sent.

## 2023-05-31 ENCOUNTER — Encounter
Admission: RE | Disposition: A | Discharge status: Home / Self Care | Payer: Self-pay | Source: Home / Self Care | Attending: Gastroenterology

## 2023-05-31 ENCOUNTER — Ambulatory Visit
Admission: RE | Admit: 2023-05-31 | Discharge: 2023-05-31 | Disposition: A | Discharge status: Home / Self Care | Payer: 59 | Attending: Gastroenterology | Admitting: Gastroenterology

## 2023-05-31 ENCOUNTER — Ambulatory Visit: Payer: 59 | Admitting: Cardiology

## 2023-05-31 HISTORY — PX: COLONOSCOPY WITH PROPOFOL: SHX5780

## 2023-05-31 SURGERY — COLONOSCOPY WITH PROPOFOL
Anesthesia: General

## 2023-05-31 NOTE — Progress Notes (Signed)
The patient arrived for her procedure with Dr Allegra Lai today, but was several hours early.  When informed about their early arrival, both the patient and her husband were understandably upset and left without being seen or admitted to endoscopy.  I spoke to the husband by phone to encourage them to return, but both the patient and husband stated they were call the office to reschedule for a later date.  I did inform the office and Dr Allegra Lai of this.

## 2023-06-01 ENCOUNTER — Encounter: Payer: Self-pay | Admitting: Gastroenterology

## 2023-06-05 ENCOUNTER — Encounter: Payer: Self-pay | Admitting: Obstetrics and Gynecology

## 2023-06-05 ENCOUNTER — Ambulatory Visit (INDEPENDENT_AMBULATORY_CARE_PROVIDER_SITE_OTHER): Payer: 59 | Admitting: Obstetrics and Gynecology

## 2023-06-05 ENCOUNTER — Other Ambulatory Visit (HOSPITAL_COMMUNITY)
Admission: RE | Admit: 2023-06-05 | Discharge: 2023-06-05 | Disposition: A | Discharge status: Home / Self Care | Payer: 59 | Source: Ambulatory Visit | Attending: Obstetrics and Gynecology | Admitting: Obstetrics and Gynecology

## 2023-06-05 VITALS — BP 128/75 | HR 96 | Ht 59.0 in | Wt 120.0 lb

## 2023-06-05 DIAGNOSIS — Z01419 Encounter for gynecological examination (general) (routine) without abnormal findings: Secondary | ICD-10-CM | POA: Insufficient documentation

## 2023-06-05 DIAGNOSIS — Z124 Encounter for screening for malignant neoplasm of cervix: Secondary | ICD-10-CM | POA: Diagnosis not present

## 2023-06-05 DIAGNOSIS — Z803 Family history of malignant neoplasm of breast: Secondary | ICD-10-CM | POA: Insufficient documentation

## 2023-06-05 DIAGNOSIS — Z758 Other problems related to medical facilities and other health care: Secondary | ICD-10-CM

## 2023-06-05 DIAGNOSIS — Z603 Acculturation difficulty: Secondary | ICD-10-CM

## 2023-06-05 NOTE — Progress Notes (Signed)
Obstetrics and Gynecology Annual Patient Evaluation  Appointment Date: 06/05/2023  OBGYN Clinic: [facility]  Primary Care Provider: Mort Sawyers  Chief Complaint:  Chief Complaint  Patient presents with   Gynecologic Exam    History of Present Illness: Olivia [name] is a 47 y.o.  [place]  [id number] (No LMP recorded. Patient has had an implant.), seen for the above chief complaint. Her past medical history is significant for FHx of aunt with breast cancer diagnosed with breast cancer at age 65 and passed away in [place] four years ago.  Patient doing well and no problems or issues  Review of Systems: A comprehensive review of systems was negative.   Past Medical History:  Past Medical History:  Diagnosis Date   Other fatigue 08/02/2020   Seasonal allergies 08/02/2020    Past Surgical History:  Past Surgical History:  Procedure Laterality Date   COLONOSCOPY WITH PROPOFOL N/A 05/31/2023   Procedure: COLONOSCOPY WITH PROPOFOL;  Surgeon: Toney Reil, MD;  Location: [facility] ENDOSCOPY;  Service: Gastroenterology;  Laterality: N/A;  [place]   NO PAST SURGERIES      Past Obstetrical History:  OB History  Gravida Para Term Preterm AB Living  3 3 3     3   SAB IAB Ectopic Multiple Live Births          3    # Outcome Date GA Lbr Len/2nd Weight Sex Type Anes PTL Lv  3 Term 2012     Vag-Spont     2 Term 2005     Vag-Spont     1 Term 2002     Vag-Spont       Past Gynecological History: As per HPI. Periods: none History of Pap Smear(s): Yes.   Last pap 2020, which was negative She is currently using  Nexplanon placed 03/2022  for contraception.   Social History:  Social History   Socioeconomic History   Marital status: Married    Spouse name: Savonglam   Number of children: 3   Years of education: high school   Highest education level: Not on file  Occupational History    Employer: [employer]  Tobacco Use   Smoking  status: Never   Smokeless tobacco: Never  Vaping Use   Vaping status: Never Used  Substance and Sexual Activity   Alcohol use: Yes    Comment: occasional   Drug use: Never   Sexual activity: Yes    Partners: Male    Birth control/protection: Implant  Other Topics Concern   Not on file  Social History Narrative   08/02/20   From: [place], moved 2011   Living: with husband, Sovong lam (2011) and 3 children   Work: Chief Strategy Officer       Family: 3 children - Taam (2002), Charlton Haws (2005, Melanee Spry (2012)       Enjoys: go to [employer]      Exercise: not currently   Diet: home cooked, variety of meat and veggies      Safety   Seat belts: Yes    Guns: Yes  and secure   Safe in relationships: Yes    Social Determinants of Corporate investment banker Strain: Not on file  Food Insecurity: Not on file  Transportation Needs: Not on file  Physical Activity: Not on file  Stress: Not on file  Social Connections: Not on file  Intimate Partner Violence: Not on file    Family History:  Family History  Problem Relation Age of Onset   Cervical cancer Mother 58   Stroke Father    Hypertension Father    Heart attack Maternal Grandfather 34   Breast cancer Maternal Aunt 49   Breast cancer Maternal Aunt    Cervical cancer Maternal Aunt 45    Health Maintenance:  Mammogram(s): negative 07/2022  Medications [name] had no medications administered during this visit. Current Outpatient Medications  Medication Sig Dispense Refill   etonogestrel (NEXPLANON) 68 MG IMPL implant Nexplanon 68 mg subdermal implant  Inject by subcutaneous route. Due for removal in 3 years     loratadine (CLARITIN) 10 MG tablet Take 10 mg by mouth daily.     No current facility-administered medications for this visit.    Allergies Patient has no known allergies.   Physical Exam:  BP 128/75   Pulse 96   Ht 4\' 11"  (1.499 m)   Wt 120 lb (54.4 kg)   BMI 24.24 kg/m  Body mass index is 24.24 kg/m. General  appearance: Well nourished, well developed female in no acute distress.  Neck:  Supple, normal appearance, and no thyromegaly  Cardiovascular: normal s1 and s2.  No murmurs, rubs or gallops. Respiratory:  Clear to auscultation bilateral. Normal respiratory effort Abdomen: positive bowel sounds and no masses, hernias; diffusely non tender to palpation, non distended Breasts: breasts appear normal, no suspicious masses, no skin or nipple changes or axillary nodes, and normal palpation. Neuro/Psych:  Normal mood and affect.  Skin:  Warm and dry.  Lymphatic:  No inguinal lymphadenopathy.   Cervical exam performed in the presence of a chaperone Pelvic exam: is not limited by body habitus EGBUS: within normal limits Vagina: within normal limits and with no blood or discharge in the vault Cervix: normal appearing cervix without tenderness, discharge or lesions. Uterus:  nonenlarged and non tender Adnexa:  normal adnexa and no mass, fullness, tenderness Rectovaginal: deferred  Laboratory: none  Radiology: none  Assessment: patient doing well  Plan:  1. Well woman exam with routine gynecological exam Declines STI screening. - Cytology - PAP  2. Cervical cancer screening - Cytology - PAP  3. Family history of breast cancer D/w her and patient to consider genetic counseling. Pt getting qyear mammograms and I told her to continue this.  Patient thought she felt something on her right chest wall at the LUQ about 5cm from the nipple but nothing on palpation and maybe something if she has her arm down while laying supine but also maybe felt on the contralateral side. Pt states she's noticed it for years and she's had negative mammograms so I told her it's most likely musculoskeletal/skin  [employer] used.   Cornelia Copa MD Attending [facility] for [facility])

## 2023-06-05 NOTE — Progress Notes (Signed)
Patient presents for Annual.  LMP: No cycle with Nexplanon Last pap:  12/09/2019 WNL Contraception: Nexplanon Mammogram:  08/01/22 & 08/10/2022 STD Screening: Declines   CC: Annual/None

## 2023-06-06 LAB — CYTOLOGY - PAP
Comment: NEGATIVE
Diagnosis: NEGATIVE
High risk HPV: NEGATIVE

## 2023-06-13 ENCOUNTER — Telehealth: Payer: Self-pay | Admitting: Lactation Services

## 2023-06-13 NOTE — Telephone Encounter (Signed)
Called patient with assistance of [employer] [place], [place] #[phone] to give results and recommendations of recent Pap Smear.   Patient did not answer. LM for patient to call the office for non urgent test results. Will try to call one more time and then send letter if needed.

## 2023-06-13 NOTE — Telephone Encounter (Signed)
-----   Message from [place] sent at 06/11/2023  6:20 PM EST ----- All normal. Can you let her know that she needs another routine pap smear in 3-5 years. thanks

## 2023-06-14 NOTE — Telephone Encounter (Signed)
Called patient using [employer] ID# [id number] at number listed in chart--sent to unidentified VM--left message to call office for non urgent test results. Letter created for results and given to front office to send to patient via mail at address listed in chart.   Maureen Ralphs RN on 06/14/23 at [phone]

## 2023-06-15 ENCOUNTER — Telehealth: Payer: Self-pay | Admitting: Lactation Services

## 2023-06-15 NOTE — Telephone Encounter (Signed)
Patient returned call to front office . Returned call to patient with assistance of [employer], #[id number].   Patient informed of normal Pap Smear with repeat recommended in 3-5 years. Reviewed would still recommend annual wellness exams. Patient voiced understanding.

## 2023-08-03 ENCOUNTER — Ambulatory Visit
Admission: RE | Admit: 2023-08-03 | Discharge: 2023-08-03 | Disposition: A | Discharge status: Home / Self Care | Payer: 59 | Source: Ambulatory Visit | Attending: Obstetrics and Gynecology | Admitting: Obstetrics and Gynecology

## 2023-08-03 DIAGNOSIS — Z01419 Encounter for gynecological examination (general) (routine) without abnormal findings: Secondary | ICD-10-CM

## 2023-08-03 DIAGNOSIS — Z803 Family history of malignant neoplasm of breast: Secondary | ICD-10-CM

## 2023-08-03 DIAGNOSIS — Z1231 Encounter for screening mammogram for malignant neoplasm of breast: Secondary | ICD-10-CM | POA: Diagnosis not present

## 2024-06-06 ENCOUNTER — Other Ambulatory Visit (HOSPITAL_COMMUNITY)
Admission: RE | Admit: 2024-06-06 | Discharge: 2024-06-06 | Disposition: A | Discharge status: Home / Self Care | Source: Ambulatory Visit | Attending: Obstetrics and Gynecology | Admitting: Obstetrics and Gynecology

## 2024-06-06 ENCOUNTER — Ambulatory Visit: Admitting: Obstetrics and Gynecology

## 2024-06-06 VITALS — BP 131/83 | HR 88 | Ht 59.0 in | Wt 127.8 lb

## 2024-06-06 DIAGNOSIS — Z202 Contact with and (suspected) exposure to infections with a predominantly sexual mode of transmission: Secondary | ICD-10-CM

## 2024-06-06 DIAGNOSIS — Z0000 Encounter for general adult medical examination without abnormal findings: Secondary | ICD-10-CM | POA: Diagnosis present

## 2024-06-06 DIAGNOSIS — Z975 Presence of (intrauterine) contraceptive device: Secondary | ICD-10-CM

## 2024-06-06 DIAGNOSIS — N3946 Mixed incontinence: Secondary | ICD-10-CM

## 2024-06-06 DIAGNOSIS — Z758 Other problems related to medical facilities and other health care: Secondary | ICD-10-CM

## 2024-06-06 DIAGNOSIS — Z803 Family history of malignant neoplasm of breast: Secondary | ICD-10-CM | POA: Diagnosis not present

## 2024-06-06 DIAGNOSIS — Z603 Acculturation difficulty: Secondary | ICD-10-CM

## 2024-06-06 DIAGNOSIS — Z01419 Encounter for gynecological examination (general) (routine) without abnormal findings: Secondary | ICD-10-CM | POA: Insufficient documentation

## 2024-06-06 DIAGNOSIS — R350 Frequency of micturition: Secondary | ICD-10-CM

## 2024-06-06 DIAGNOSIS — Z23 Encounter for immunization: Secondary | ICD-10-CM

## 2024-06-06 DIAGNOSIS — R030 Elevated blood-pressure reading, without diagnosis of hypertension: Secondary | ICD-10-CM | POA: Diagnosis not present

## 2024-06-06 LAB — POCT URINALYSIS DIPSTICK

## 2024-06-06 MED ORDER — CEFADROXIL 500 MG PO CAPS
500.0000 mg | ORAL_CAPSULE | Freq: Two times a day (BID) | ORAL | 0 refills | Status: AC
Start: 2024-06-06 — End: ?

## 2024-06-06 NOTE — Progress Notes (Signed)
 Patient presents for Annual.  LMP: No LMP recorded. Patient has had an implant.  Last pap: Date: 06/05/23-WNL Contraception: Nexplanon  04/13/22 Mammogram: Up to date: 08/03/23 STD Screening: Accepts Flu Vaccine : Accepts  CC: Frequent urination x couple months

## 2024-06-07 LAB — HEMOGLOBIN A1C
Est. average glucose Bld gHb Est-mCnc: 111 mg/dL
Hgb A1c MFr Bld: 5.5 % (ref 4.8–5.6)

## 2024-06-07 LAB — COMPREHENSIVE METABOLIC PANEL WITH GFR
ALT: 13 IU/L (ref 0–32)
AST: 18 IU/L (ref 0–40)
Albumin: 4.5 g/dL (ref 3.9–4.9)
Alkaline Phosphatase: 51 IU/L (ref 41–116)
BUN/Creatinine Ratio: 17 (ref 9–23)
BUN: 10 mg/dL (ref 6–24)
Bilirubin Total: <0.2 mg/dL (ref 0.0–1.2)
CO2: 22 mmol/L (ref 20–29)
Calcium: 9.2 mg/dL (ref 8.7–10.2)
Chloride: 102 mmol/L (ref 96–106)
Creatinine, Ser: 0.59 mg/dL (ref 0.57–1.00)
Globulin, Total: 3.1 g/dL (ref 1.5–4.5)
Glucose: 91 mg/dL (ref 70–99)
Potassium: 4 mmol/L (ref 3.5–5.2)
Sodium: 138 mmol/L (ref 134–144)
Total Protein: 7.6 g/dL (ref 6.0–8.5)
eGFR: 112 mL/min/1.73 (ref >59)

## 2024-06-07 LAB — CBC
Hematocrit: 38.3 % (ref 34.0–46.6)
Hemoglobin: 12.4 g/dL (ref 11.1–15.9)
MCH: 28.6 pg (ref 26.6–33.0)
MCHC: 32.4 g/dL (ref 31.5–35.7)
MCV: 88 fL (ref 79–97)
Platelets: 300 x10E3/uL (ref 150–450)
RBC: 4.34 x10E6/uL (ref 3.77–5.28)
RDW: 12.4 % (ref 11.7–15.4)
WBC: 7.6 x10E3/uL (ref 3.4–10.8)

## 2024-06-07 LAB — MICROSCOPIC EXAMINATION
Casts: NONE SEEN /LPF
Epithelial Cells (non renal): >10 /HPF — AB (ref 0–10)
RBC, Urine: NONE SEEN /HPF (ref 0–2)
WBC, UA: NONE SEEN /HPF (ref 0–5)

## 2024-06-07 LAB — RPR+HBSAG+HCVAB+...
HIV Screen 4th Generation wRfx: NONREACTIVE
Hep C Virus Ab: NONREACTIVE
Hepatitis B Surface Ag: NEGATIVE
RPR Ser Ql: NONREACTIVE

## 2024-06-07 LAB — URINALYSIS, ROUTINE W REFLEX MICROSCOPIC
Bilirubin, UA: NEGATIVE
Glucose, UA: NEGATIVE
Ketones, UA: NEGATIVE
Nitrite, UA: NEGATIVE
Protein,UA: NEGATIVE
RBC, UA: NEGATIVE
Specific Gravity, UA: 1.013 (ref 1.005–1.030)
Urobilinogen, Ur: 0.2 mg/dL (ref 0.2–1.0)
pH, UA: 6 (ref 5.0–7.5)

## 2024-06-07 LAB — LIPID PANEL
Chol/HDL Ratio: 3.7 ratio (ref 0.0–4.4)
Cholesterol, Total: 218 mg/dL — ABNORMAL HIGH (ref 100–199)
HDL: 59 mg/dL (ref >39)
LDL Chol Calc (NIH): 131 mg/dL — ABNORMAL HIGH (ref 0–99)
Triglycerides: 157 mg/dL — ABNORMAL HIGH (ref 0–149)
VLDL Cholesterol Cal: 28 mg/dL (ref 5–40)

## 2024-06-07 LAB — TSH RFX ON ABNORMAL TO FREE T4: TSH: 1.34 u[IU]/mL (ref 0.450–4.500)

## 2024-06-08 LAB — URINE CULTURE: Organism ID, Bacteria: NO GROWTH

## 2024-06-09 LAB — CERVICOVAGINAL ANCILLARY ONLY
Bacterial Vaginitis (gardnerella): NEGATIVE
Candida Glabrata: NEGATIVE
Candida Vaginitis: NEGATIVE
Chlamydia: NEGATIVE
Comment: NEGATIVE
Comment: NEGATIVE
Comment: NEGATIVE
Comment: NEGATIVE
Comment: NEGATIVE
Comment: NORMAL
Neisseria Gonorrhea: NEGATIVE
Trichomonas: NEGATIVE

## 2024-06-09 NOTE — Progress Notes (Signed)
 Obstetrics and Gynecology Annual Patient Evaluation  Appointment Date: 06/06/2024  OBGYN Clinic: [facility]  Primary Care Provider: Corwin Antu  Chief Complaint:  Chief Complaint  Patient presents with   Annual Exam  Increased urinary frequency for the past few months  History of Present Illness: Olivia [name] Copelyn Widmer is a 48 y.o. [phone] (No LMP recorded. Patient has had an implant.), seen for the above chief complaint. Her past medical history is significant for FHx of aunt with breast cancer diagnosed with breast cancer at age 4 and passed away in [place] four-five years ago.    She gets up once during the night to void. She also endorses OAB s/s as well as SUI s/s and it sounds like she may have incomplete voiding, at times. No smell to urine, dysuria, hematuria and no vaginal s/s.   Review of Systems: Pertinent items are noted in HPI.   Patient Active Problem List   Diagnosis Date Noted   Language barrier 06/06/2024   Transient hypertension 06/06/2024   Family history of breast cancer 06/05/2023   Other fatigue 03/08/2023   Low vitamin D  level 03/08/2023   Palpitations 03/08/2023   Glucosuria 06/05/2022   Mixed hyperlipidemia 10/18/2021   Nexplanon  in place since 03/2022 12/09/2019   Past Medical History:  Past Medical History:  Diagnosis Date   Other fatigue 08/02/2020   Seasonal allergies 08/02/2020   Past Surgical History:  Past Surgical History:  Procedure Laterality Date   COLONOSCOPY WITH PROPOFOL  N/A 05/31/2023   Procedure: COLONOSCOPY WITH PROPOFOL ;  Surgeon: Unk Corinn Skiff, MD;  Location: [facility] ENDOSCOPY;  Service: Gastroenterology;  Laterality: N/A;  [place]   NO PAST SURGERIES     Past Obstetrical History:  OB History  Gravida Para Term Preterm AB Living  3 3 3   3   SAB IAB Ectopic Multiple Live Births      3    # Outcome Date GA Lbr Len/2nd Weight Sex Type Anes PTL Lv  3 Term 2012     Vag-Spont     2 Term  2005     Vag-Spont     1 Term 2002     Vag-Spont      Past Gynecological History: As per HPI. Periods: none History of Pap Smear(s): Yes.   Last pap 05/2023, which was negative and HPV negative She is currently using Nexplanon  placed 03/2022 for contraception.   Social History:  Social History   Socioeconomic History   Marital status: Married    Spouse name: Savonglam   Number of children: 3   Years of education: high school   Highest education level: Not on file  Occupational History    Employer: [employer]  Tobacco Use   Smoking status: Never   Smokeless tobacco: Never  Vaping Use   Vaping status: Never Used  Substance and Sexual Activity   Alcohol use: Yes    Comment: occasional   Drug use: Never   Sexual activity: Yes    Partners: Male    Birth control/protection: Implant  Other Topics Concern   Not on file  Social History Narrative   08/02/20   From: [place], moved 2011   Living: with husband, Sovong lam (2011) and 3 children   Work: chief strategy officer       Family: 3 children - Taam (2002), Tien (2005, Chauncey (2012)       Enjoys: go to [employer]      Exercise: not currently   Diet:  home cooked, variety of meat and veggies      Safety   Seat belts: Yes    Guns: Yes  and secure   Safe in relationships: Yes    Social Drivers of Corporate Investment Banker Strain: Not on file  Food Insecurity: Not on file  Transportation Needs: Not on file  Physical Activity: Not on file  Stress: Not on file  Social Connections: Not on file  Intimate Partner Violence: Not on file   Family History:  Family History  Problem Relation Age of Onset   Cervical cancer Mother 34   Stroke Father    Hypertension Father    Heart attack Maternal Grandfather 72   Breast cancer Maternal Aunt 49   Breast cancer Maternal Aunt    Cervical cancer Maternal Aunt 45   Medications Nitya T. Stills had no medications administered during this visit. Current Outpatient Medications  Medication  Sig Dispense Refill   etonogestrel  (NEXPLANON ) 68 MG IMPL implant Nexplanon  68 mg subdermal implant  Inject by subcutaneous route. Due for removal in 3 years     loratadine (CLARITIN) 10 MG tablet Take 10 mg by mouth daily.     No current facility-administered medications for this visit.   Allergies Patient has no known allergies.  Physical Exam:  BP 131/83   Pulse 88   Ht 4' 11 (1.499 m)   Wt 127 lb 12.8 oz (58 kg)   BMI 25.81 kg/m  Body mass index is 25.81 kg/m. CMA present for breast and pelvic exam General appearance: Well nourished, well developed female in no acute distress.  Neck:  Supple, normal appearance, and no thyromegaly  Cardiovascular: normal s1 and s2.  No murmurs, rubs or gallops. Respiratory:  Clear to auscultation bilateral. Normal respiratory effort Abdomen: positive bowel sounds and no masses, hernias; diffusely non tender to palpation, non distended Breasts: breasts appear normal, no suspicious masses, no skin or nipple changes or axillary nodes, and normal palpation. Neuro/Psych:  Normal mood and affect.  Skin:  Warm and dry. Nexplanon  intact, nttp in LUE Lymphatic:  No inguinal lymphadenopathy.   Pelvic exam: is not limited by body habitus EGBUS: within normal limits Vagina: within normal limits and with no blood or discharge in the vault. Weak muscle contraction. With valsalva, Aa to -1 Cervix: normal appearing cervix without tenderness, discharge or lesions. Uterus:  nonenlarged and non tender Adnexa:  normal adnexa and no mass, fullness, tenderness Rectovaginal: deferred  Laboratory: UA dip with trace leuks and mod blood  Radiology: none  Assessment: patient doing well  Plan:  1. Increased urinary frequency (Primary) Duricef and follow up labs. D/w pt pelvic floor PT referral if negative for infections - Urine Culture - Urinalysis, Routine w reflex microscopic - POCT Urinalysis Dipstick  2. Language barrier In person interpreter  used  3. Well woman exam (no gynecological exam) Colonoscopy up to date - Cervicovaginal ancillary only - RPR+HBsAg+HCVAb+... - Flu vaccine trivalent PF, 6mos and older(Flulaval,Afluria,Fluarix,Fluzone) - Urine Culture - CBC - Comprehensive metabolic panel with GFR - Hemoglobin A1c - Lipid panel - TSH Rfx on Abnormal to Free T4 - Urinalysis, Routine w reflex microscopic - POCT Urinalysis Dipstick  4. Transient hypertension Repeat negative  5. Family history of breast cancer Still declines genetic referral. Jan 2025 mammo negative - MM 3D SCREENING MAMMOGRAM BILATERAL BREAST; Future  6. STD exposure - Cervicovaginal ancillary only - RPR+HBsAg+HCVAb+...  7. Urinary incontinence, mixed  8. Nexplanon  in place since 03/2022 Can d/w her  re: removing next year or going to 4 years  Orders Placed This Encounter  Procedures   Urine Culture   Microscopic Examination   MM 3D SCREENING MAMMOGRAM BILATERAL BREAST   Flu vaccine trivalent PF, 6mos and older(Flulaval,Afluria,Fluarix,Fluzone)   RPR+HBsAg+HCVAb+...   CBC   Comprehensive metabolic panel with GFR   Hemoglobin A1c   Lipid panel   TSH Rfx on Abnormal to Free T4   Urinalysis, Routine w reflex microscopic   Urinalysis, Routine w reflex microscopic   TSH Rfx on Abnormal to Free T4   POCT Urinalysis Dipstick    No follow-ups on file.  Future Appointments  Date Time Provider Department Center  08/04/2024  7:30 AM [facility] MM 3 [facility]    Bebe Izell Raddle MD Attending [facility])

## 2024-06-10 ENCOUNTER — Telehealth: Payer: Self-pay | Admitting: *Deleted

## 2024-06-10 NOTE — Telephone Encounter (Signed)
-----   Message from Whitmer sent at 06/09/2024  7:08 PM EST ----- Regarding: stop abx and Pelvic floor PT referral sent in Can you let her know to stop the abx since the urine cx came back negative and that I sent off a pelvic floor PT referral for her? Thanks

## 2024-06-10 NOTE — Telephone Encounter (Signed)
 Attempted to call patient and her spouse using interpreter services. Left voice mail on each of their numbers that I will try again tomorrow to reach out

## 2024-06-11 ENCOUNTER — Telehealth: Payer: Self-pay | Admitting: *Deleted

## 2024-06-11 NOTE — Telephone Encounter (Signed)
-----   Message from Brundidge sent at 06/09/2024  7:08 PM EST ----- Regarding: stop abx and Pelvic floor PT referral sent in Can you let her know to stop the abx since the urine cx came back negative and that I sent off a pelvic floor PT referral for her? Thanks

## 2024-06-11 NOTE — Telephone Encounter (Signed)
 Called pts husband using interpreter services , husband actually speaks english, so he was informed of negative CX results and patient can stop antibiotics and referral was also placed for pelvic floor PT

## 2024-06-30 ENCOUNTER — Ambulatory Visit: Payer: Self-pay | Admitting: Family

## 2024-06-30 NOTE — Progress Notes (Signed)
 No anemia Metabolic panel looks good A1c normal limits

## 2024-08-04 ENCOUNTER — Ambulatory Visit
